# Patient Record
Sex: Female | Born: 1985 | Race: Black or African American | Hispanic: No | Marital: Single | Smoking: Current every day smoker
Health system: Southern US, Community
[De-identification: ages and names within clinical notes are randomized; demographics above are authoritative.]

## PROBLEM LIST (undated history)

## (undated) HISTORY — PX: OTHER SURGICAL HISTORY: SHX169

## (undated) HISTORY — PX: KIDNEY STONE SURGERY: SHX686

---
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
## 2004-08-30 ENCOUNTER — Emergency Department (HOSPITAL_COMMUNITY): Admission: EM | Admit: 2004-08-30 | Discharge: 2004-08-30 | Payer: Self-pay | Admitting: Emergency Medicine

## 2004-09-28 ENCOUNTER — Emergency Department (HOSPITAL_COMMUNITY): Admission: EM | Admit: 2004-09-28 | Discharge: 2004-09-28 | Payer: Self-pay | Admitting: Emergency Medicine

## 2005-11-22 ENCOUNTER — Emergency Department (HOSPITAL_COMMUNITY): Admission: EM | Admit: 2005-11-22 | Discharge: 2005-11-22 | Payer: Self-pay | Admitting: Emergency Medicine

## 2005-11-30 ENCOUNTER — Observation Stay (HOSPITAL_COMMUNITY): Admission: AD | Admit: 2005-11-30 | Discharge: 2005-12-01 | Payer: Self-pay | Admitting: *Deleted

## 2006-01-03 ENCOUNTER — Observation Stay (HOSPITAL_COMMUNITY): Admission: RE | Admit: 2006-01-03 | Discharge: 2006-01-04 | Payer: Self-pay | Admitting: *Deleted

## 2006-01-17 ENCOUNTER — Ambulatory Visit (HOSPITAL_COMMUNITY): Admission: RE | Admit: 2006-01-17 | Discharge: 2006-01-17 | Payer: Self-pay | Admitting: *Deleted

## 2006-02-28 ENCOUNTER — Ambulatory Visit (HOSPITAL_COMMUNITY): Admission: RE | Admit: 2006-02-28 | Discharge: 2006-02-28 | Payer: Self-pay | Admitting: Obstetrics and Gynecology

## 2006-04-14 ENCOUNTER — Emergency Department (HOSPITAL_COMMUNITY): Admission: EM | Admit: 2006-04-14 | Discharge: 2006-04-14 | Payer: Self-pay | Admitting: Emergency Medicine

## 2006-06-22 ENCOUNTER — Inpatient Hospital Stay (HOSPITAL_COMMUNITY): Admission: AD | Admit: 2006-06-22 | Discharge: 2006-06-24 | Payer: Self-pay | Admitting: Obstetrics and Gynecology

## 2007-01-21 ENCOUNTER — Emergency Department (HOSPITAL_COMMUNITY): Admission: EM | Admit: 2007-01-21 | Discharge: 2007-01-21 | Payer: Self-pay | Admitting: Emergency Medicine

## 2007-11-24 ENCOUNTER — Emergency Department (HOSPITAL_COMMUNITY): Admission: EM | Admit: 2007-11-24 | Discharge: 2007-11-24 | Payer: Self-pay | Admitting: Emergency Medicine

## 2010-11-22 ENCOUNTER — Encounter: Payer: Self-pay | Admitting: *Deleted

## 2010-12-06 ENCOUNTER — Emergency Department (HOSPITAL_COMMUNITY)
Admission: EM | Admit: 2010-12-06 | Discharge: 2010-12-06 | Disposition: A | Discharge status: Home / Self Care | Payer: Self-pay | Attending: Emergency Medicine | Admitting: Emergency Medicine

## 2010-12-06 DIAGNOSIS — R112 Nausea with vomiting, unspecified: Secondary | ICD-10-CM | POA: Insufficient documentation

## 2010-12-06 LAB — DIFFERENTIAL
Eosinophils Absolute: 0 10*3/uL (ref 0.0–0.7)
Lymphocytes Relative: 6 % — ABNORMAL LOW (ref 12–46)
Lymphs Abs: 0.7 10*3/uL (ref 0.7–4.0)
Monocytes Absolute: 0.3 10*3/uL (ref 0.1–1.0)
Neutro Abs: 10.6 10*3/uL — ABNORMAL HIGH (ref 1.7–7.7)
Neutrophils Relative %: 91 % — ABNORMAL HIGH (ref 43–77)

## 2010-12-06 LAB — URINALYSIS, ROUTINE W REFLEX MICROSCOPIC
Leukocytes, UA: NEGATIVE
Nitrite: POSITIVE — AB
Specific Gravity, Urine: >1.03 — ABNORMAL HIGH (ref 1.005–1.030)
Urine Glucose, Fasting: NEGATIVE mg/dL
Urobilinogen, UA: 0.2 mg/dL (ref 0.0–1.0)
pH: 5.5 (ref 5.0–8.0)

## 2010-12-06 LAB — BASIC METABOLIC PANEL
BUN: 14 mg/dL (ref 6–23)
CO2: 23 mEq/L (ref 19–32)
Calcium: 9.3 mg/dL (ref 8.4–10.5)
Chloride: 107 mEq/L (ref 96–112)
Creatinine, Ser: 0.86 mg/dL (ref 0.4–1.2)
GFR calc Af Amer: >60 mL/min (ref >60)
Sodium: 138 mEq/L (ref 135–145)

## 2010-12-06 LAB — CBC
HCT: 38.5 % (ref 36.0–46.0)
MCH: 31.9 pg (ref 26.0–34.0)
RDW: 12.1 % (ref 11.5–15.5)

## 2010-12-06 LAB — URINE MICROSCOPIC-ADD ON

## 2011-03-19 NOTE — Op Note (Signed)
[name], [name]                ACCOUNT NO.:  [id number]   MEDICAL RECORD NO.:  [id number]          PATIENT TYPE:  INP   LOCATION:  LDR4                          FACILITY:  [facility]   PHYSICIAN:  Tilda Burrow, M.D. DATE OF BIRTH:  07-24-1986   DATE OF PROCEDURE:  DATE OF DISCHARGE:                                  PROCEDURE NOTE   DELIVERY SUMMARY   LENGTH OF FIRST STAGE OF LABOR:  6 hours 40 minutes   LENGTH OF SECOND STAGE OF LABOR:  20 minutes   LENGTH OF THIRD STAGE OF LABOR:  7 minutes   DELIVERY NOTE:  [name] had a normal spontaneous vaginal delivery of a viable  female infant.  Upon delivery and inspection there is a second degree  perineal laceration noted.  Infant upon delivery was thoroughly suctioned,  dried, cord clamped, to mother's abdomen for newborn care.  Apgars were 9  and 9.  Third stage of labor was actively managed with 20 units of Pitocin  in 1000 mL of D5LR at a rapid rate.  Placenta was delivered spontaneously  via Schultz's mechanism.  Three-vessel cord is noted.  Membranes are noted  to intact upon inspection.  Cord blood gas and cord blood was obtained.  Laceration was repaired with 30 mL of 1% lidocaine and 2-0 Vicryl two  interrupted stitches in subcuticular to close.  Good hemostasis was  obtained.  Estimated blood loss was approximately 350 mL.  Infant and mother  both stabilized and transferred out to the postpartum unit in stable  condition.      [name], Lanier Clam      Tilda Burrow, M.D.  Electronically Signed    DL/MEDQ  D:  62/70/3500  T:  06/22/2006  Job:  [id number]

## 2011-03-19 NOTE — H&P (Signed)
[name], SADEK NO.:  [id number]   MEDICAL RECORD NO.:  [id number]          PATIENT TYPE:  INP   LOCATION:  LDR4                          FACILITY:  [facility]   PHYSICIAN:  Tilda Burrow, M.D. DATE OF BIRTH:  October 01, 1986   DATE OF ADMISSION:  06/22/2006  DATE OF DISCHARGE:  LH                                HISTORY & PHYSICAL   REASON FOR ADMISSION:  Pregnancy at term with active labor, admitted by EMS.   MEDICAL HISTORY:  Positive for incompetent cervix.   SURGICAL HISTORY:  Positive for cerclage which was removed by the [facility]  practice several weeks ago.   ALLERGIES:  She has no known allergies to medications.   MEDICATIONS:  She is not taking any medications at the present time.   FAMILY HISTORY:  Negative.   PRENATAL COURSE:  Her prenatal record has been requested from [facility]. Her HIV is negative. Her blood type is positive but other labs are  pending upon receiving her complete prenatal record from [facility].   PHYSICAL EXAMINATION:  Cervix is 6 cm, completely effaced, +1 station. Vital  signs were stable. Fetal heart pattern is stable.   PLAN:  We are going to admit and expect vaginal delivery and GBS prophylaxis  due to unknown GBS status.      [name], Lanier Clam      Tilda Burrow, M.D.  Electronically Signed    DL/MEDQ  D:  04/54/0981  T:  06/22/2006  Job:  [id number]   cc:   Donna Bernard, M.D.  Fax: [phone]   [facility]

## 2011-03-19 NOTE — H&P (Signed)
NAMEALEXIYA, [name]                ACCOUNT NO.:  [id number]   MEDICAL RECORD NO.:  [id number]          PATIENT TYPE:  OBV   LOCATION:  A419                          FACILITY:  [facility]   PHYSICIAN:  Langley Gauss, MD     DATE OF BIRTH:  [birth year]   DATE OF ADMISSION:  01/03/2006  DATE OF DISCHARGE:  LH                                HISTORY & PHYSICAL   The patient is a 25 year old gravida 3, para 0-0-2-0 at 43 weeks' gestation  who is to be observation status through outpatient surgical unit for  placement of a McDonald cerclage. The patient currently at 15 weeks'  gestation. Her pregnancy at this point in time has been complicated by  minimal amount of vaginal spotting and finding of trichomonas on Pap smear  November 02, 2005. Extensive time and effort is spent during this prenatal  course reviewing the patient's prior history with two prior pregnancy  losses. Records obtained and reviewed from [name]. The patient has had two prior pregnancy  losses. The patient's first pregnancy was followed [name]. Inconsistency is noted in the record here in that there it is stated  gravida 3, para 0-0-2-0, record indicating that she had elective AB and a  spontaneous AB with a false-positive HCG. However, the patient contended  that at that time that was her first pregnancy. She denies any prior history  of elective AB. What happened was on September 23, 2003 at home the patient  experienced spontaneous rupture of membranes but then did not present to  [facility] until September 24, 2003 complaining of bleeding and cramping. At  that point in time, Dr. Christin Bach cared for the patient at [name].  Diagnoses was 17.5 weeks' gestation, inevitable abortion secondary to  premature preterm ruptured membranes. The patient most pertinently describes  no uterine cramping until the day following spontaneous rupture of  membranes. The patient had experienced fetal movement and had prior  documented fetal cardiac activity during that pregnancy. The patient  subsequently was managed expectantly and actually progressed to vaginal  delivery of a 17-week fetal demise weighing 161 grams with placenta showing  moderate to extensive chorioamnionitis. GC and chlamydia cultures noted to  be negative. HIV negative. Rh positive status had been confirmed. No  additional useful history was obtained from the patient's prenatal record,  and there is no indication of any additional follow up following that  pregnancy loss. Subsequently, the patient had a second pregnancy. That  pregnancy, she also received care at [name] prenatally. She had  been seen in the emergency room September 28, 2004 complaining of some pelvic  pain but no vaginal bleeding seen in the emergency room at that time.  Subsequently, the patient did have recurrent problems, presenting to  [name] on November 17, 2004, brought into the emergency room at  [name] by EMS complaining of large amount of vaginal bleeding, severe  cramping in abdomen and pelvis. While sleeping tonight, the patient stated  she had awakened from her  sleep. One week prior OB ultrasound had been  normal at [facility] At that time, apparently Dr. Emelda Fear was no longer  privileged for care at [name], so Dr. Almetta Lovely was contacted to care  for the patient as an OB unassigned. Patient subsequently provided the  additional history that she presented to the emergency room where fetal  heart tones were unobtainable, and shortly after arrival when the nurse was  in the room, the patient became nauseated, sat up on all fours and vomited  and delivered the fetus which was noted to be nonliving, very thin umbilical  cord which was noted to break. However, the placenta subsequently did  spontaneously separate. Dr. Gilford Silvius was contacted for continuing care, but   according to the patient, she was discharged to home on the same date of  service from the labor and delivery. The final pathology revealed a 181-g  female fetus and placenta which appeared to be intact. Subsequently according  to the patient, this is her third pregnancy.   PAST MEDICAL HISTORY:  She otherwise has no other medical or surgical  history. She has no known drug allergies.   CURRENT MEDICATIONS:  The patient was having difficulty taking prenatal  vitamins. She likewise had a significant amount of first trimester nausea  and vomiting with weight loss. Has been tried on p.o. Phenergan, Reglan and  Pepcid as an outpatient without any of these providing any significant  therapeutic response.   She has been hospitalized one time during this pregnancy for the hyperemesis  with weight loss. This pregnancy, likewise, she is noted to have negative GC  and chlamydia cultures, is noted to have normal appearing crown-rump length  that is 7 weeks and 2 days with an [facility] of July 02, 2006, and most  recently fetal viability is documented one week previously with a single  viable intrauterine pregnancy noted on ultrasound. The remainder of the  patient's prenatal profile has been ordered, but the results are currently  not available.   PHYSICAL EXAMINATION:  VITAL SIGNS:  On physical examination, she is noted  to be 5 feet 2. Prepregnancy weight is 115. Her weight has now returned to  110 pounds. Blood pressure 98/57, pulse rate 80, respiratory as 20.  HEENT:  Negative. No adenopathy. Neck is supple. Thyroid is nonpalpable.  LUNGS:  Clear.  CARDIOVASCULAR:  Regular rate and rhythm.  ABDOMEN:  The abdomen is soft and nontender. She is noted to have very thin  abdomen. Uterus is soft and nontender consistent with 15-week size. Fetal  heart tones are easily auscultated.  EXTREMITIES:  Normal. PELVIC:  Pelvic examination revealed normal external genitalia. No lesions  or ulcerations  identified. She did previously have some mild minimal vaginal  spotting. Cervix is noted to be closed but is digitally very soft and seems  slightly shortened at about 3 cm in length. She is noted to have a frothy  malodorous discharge consistent with trichomonas which has been untreated to  this point in time secondary to the patient's nausea and vomiting and thus  obviously would be intolerant of p.o. Flagyl.   ASSESSMENT/PLAN:  The patient's first pregnancy loss very easily consistent  with incompetent cervix with spontaneous rupture of membranes before any  uterine cramping. Uterine cramping started following day which prompted her  seeking medical attention at [facility] and under the care of Dr. Christin Bach was noted to deliver a second trimester nonliving abortus. On the  patient's second pregnancy,  no significant notations were made regarding the  patient's prior pregnancy loss. During the second pregnancy, the patient did  have some moderate uterine cramping prior to presenting to [facility] were  subsequently she delivered the nonliving second trimester abortus shortly  after arrival to the emergency room. This likewise could be consistent with  incompetent cervix. There is some discrepancy in the records obtained  regarding whether not the patient has had a prior elective termination of  pregnancy on which would be a risk factor for development of the incompetent  cervix, but the patient at this point in time denies prior elective  termination of pregnancy, stating that this is her third pregnancy and third  pregnancy only. As stated previously, she has no living children been. In  light of consideration of a two prior second trimester pregnancy losses,  plan on placing a McDonald cerclage. Discussed extensively with the patient  that the placement of McDonald cerclage is for management and hopefully  prevention of a second trimester pregnancy loss if in fact it is due to   incompetent cervix. The patient is made fully aware that there is no  assurance or guarantee that this will allow the pregnancy to progress to  term. She is also made aware that should the onset of significant uterine  cramping occur the cerclage would have to be removed at that time to prevent  tearing the cervix. Thus, made it clear to her that the purposes cerclage is  for prevention of pregnancy loss from incompetent cervix only and not for  management of a second trimester loss for any other purposes. The patient  expresses understanding. Risks of surgery discussed with the patient to  include risks of cervical laceration or vaginal bleeding. It is scheduled as  a  possible which will be January 03, 2006 at 0730 for placement of McDonald  cerclage, likely utilizing a spinal analgesic. Due to the patient's active  trichomonas at this time which has not been treated, orders are written for 500 mg of IV Flagyl to be given immediately preoperatively.      Langley Gauss, MD  Electronically Signed     DC/MEDQ  D:  01/03/2006  T:  01/03/2006  Job:  [id number]   cc:   [facility]  Fax: [phone]

## 2011-03-19 NOTE — Op Note (Signed)
[name], [name]                ACCOUNT NO.:  [id number]   MEDICAL RECORD NO.:  [id number]          PATIENT TYPE:  OBV   LOCATION:  A419                          FACILITY:  [facility]   PHYSICIAN:  Langley Gauss, MD     DATE OF BIRTH:  [birth year]   DATE OF PROCEDURE:  01/03/2006  DATE OF DISCHARGE:                                 OPERATIVE REPORT   PREOPERATIVE DIAGNOSES:  1.  A 15-week intrauterine pregnancy.  2.  History of two second trimester pregnancy losses, history consistent      with incompetent cervix.   PROCEDURE:  Performance of placement of McDonald cerclage utilizing two  circumferential sutures of #1 Novofil.   SURGEON:  Dr. Langley Gauss.   ESTIMATED BLOOD LOSS:  Minimal.   COMPLICATIONS:  None.   SPECIMENS:  None   FINDINGS AT SURGERY:  [place] very soft cervix appears to be fingertip  dilated, very friable additional portion of cervix secondary to inflammatory  Trichomonas.   Planned procedure discussed with the patient both preoperatively and again  today in the immediate preoperative area. The patient is to be treated with  500 mg of IV Flagyl as she is noted to have Trichomonas on her Pap smear,  and she would be unable to tolerate p.o. Flagyl at this time. The patient  was taken to the operating room. Fetal heart tones were auscultated in 150s.  She then had placement of spinal analgesic without complications. She was  placed in the candy-cane lithotomy stirrups, prepped and draped usual  sterile manner. Sterile speculum examination is performed. Additional prep  is performed on the cervix. Leukorrhea is identified from the visible  portion of the cervix. The 12 o'clock position is grasped with a sponge  stick. Proceeding in a counterclockwise manner then, #1 Novofil suture is  then placed in a circumferential manner with four bites taken of the cervix,  continuing around to a pursestring which is then tied at 12 o'clock position  with a blind loop  of suture left in place. A second suture is then placed in  a likewise manner higher up on the cervix starting at the 12 o'clock  position and again continuing around a counterclockwise manner until its  again tied at the 12 o'clock position, and a blind loop of suture is placed  as an  additional knot. Minimal bleeding is noted following the procedure. The  patient is taken to the recovery room in stable condition. She will be  treated over the next 24 hours with IV Flagyl for the Trichomonas. No family  members were available to discuss operative findings immediately after  procedure.      Langley Gauss, MD  Electronically Signed     DC/MEDQ  D:  01/03/2006  T:  01/04/2006  Job:  [id number]

## 2011-03-19 NOTE — Discharge Summary (Signed)
[name], [name]                ACCOUNT NO.:  [id number]   MEDICAL RECORD NO.:  [id number]          PATIENT TYPE:  INP   LOCATION:  A403                          FACILITY:  [facility]   PHYSICIAN:  Tilda Burrow, M.D. DATE OF BIRTH:  1986/01/07   DATE OF ADMISSION:  06/22/2006  DATE OF DISCHARGE:  08/24/2007LH                                 DISCHARGE SUMMARY   ADMISSION DIAGNOSIS:  Active labor at term, admitted by Emergency Medical  Service.   DISCHARGE DIAGNOSIS:  Spontaneous vaginal delivery of a five pound, nine  ounce female without complications.  She had a second-degree laceration with  repair.   HOSPITAL COURSE:  The estimated blood loss was 350 mL.  The Apgars of the  baby were 9 and 9.  Her follow-up H&H is 9.8 and 28.8 respectively.  Her  vital signs have been stable.  She is bottle feeding.  Ms. Encarnacion was  treated prophylactically for Group-B Streptococcus due to unknown status.   DISPOSITION:  She is being discharged home today and wishes to follow up  with Dr. Roger Kill in [place].  We will make her an appointment for that in four  weeks.   DISCHARGE MEDICATIONS:  1. With #20 Tylox.  2. Motrin 800 mg, #40.      Jacklyn Shell, C.N.M.      Tilda Burrow, M.D.  Electronically Signed    FC/MEDQ  D:  06/24/2006  T:  06/24/2006  Job:  [id number]

## 2011-03-19 NOTE — Discharge Summary (Signed)
[name], [name]                ACCOUNT NO.:  [id number]   MEDICAL RECORD NO.:  [id number]          PATIENT TYPE:  OBV   LOCATION:  A419                          FACILITY:  [facility]   PHYSICIAN:  Langley Gauss, MD     DATE OF BIRTH:  02/04/1986   DATE OF ADMISSION:  01/03/2006  DATE OF DISCHARGE:  03/06/2007LH                                 DISCHARGE SUMMARY   The observation period extended from March 5 to January 04, 2006.   DIAGNOSES:  1.  A 15-week intrauterine pregnancy.  2.  History consistent with incompetent cervix.  3.  Trichomonas vaginitis.   PROCEDURE PERFORMED:  Placement of a McDonald cerclage utilizing two sutures  tied at the 12 o'clock position.   PERTINENT LABORATORY DATA:  The patient was noted to have trichomonas on pap  smear done prenatally. Thus, during this hospitalization, she was treated  with Flagyl 500 mg IV immediately preoperatively followed by 500 mg q.8h.  for treatment of the trichomonas. In addition, she does have Metrogel  vaginal gel at home which she can use as needed should vaginal discharge  occur.   DISPOSITION:  She is given instructions to follow up in the office in one  week's time. She did have a first trimester ultrasound done, but we will  ultimately require an anatomic survey at 18 weeks' gestation.   Prenatal profile 1 is ordered during this hospital stay as well as AFP  extra, but it is unclear if this will be a useful result as paper for AFP  extra was not available at [place]. The patient is given strict  instructions for modified bedrest during the remainder of the pregnancy.  Also is advised to avoid sexual intercourse. The patient is not employed, so  prolonged standing is not a problem.   HOSPITAL COURSE:  January 03, 2006, the patient was taken to the operating room  where utilizing a spinal. It was very easy to place McDonald cerclage. As  stated previously, postoperatively the patient did have a small amount of  vaginal bleeding which subsided. She had absolutely no complaints of uterine  cramping following this. She did well and was discharged home on  hospitalization day #1 with absolutely no complaints of vaginal discharge at  this time.      Langley Gauss, MD  Electronically Signed     DC/MEDQ  D:  01/04/2006  T:  01/05/2006  Job:  [id number]

## 2011-03-19 NOTE — H&P (Signed)
NAMEELLIS, KOFFLER                ACCOUNT NO.:  [id number]   MEDICAL RECORD NO.:  [id number]          PATIENT TYPE:  OIB   LOCATION:  A417                          FACILITY:  [facility]   PHYSICIAN:  Langley Gauss, MD     DATE OF BIRTH:  April 19, 1986   DATE OF ADMISSION:  11/30/2005  DATE OF DISCHARGE:  LH                                HISTORY & PHYSICAL   HISTORY OF PRESENT ILLNESS:  The patient is a 25 year old gravida 1, para 0  at 9+ weeks gestation as verified by a 7-week ultrasound.  She was noted to  have fetal heart tones with Doppler today.  She was seen in the office 2  weeks previously with a weight of 112 pounds and complaining of some mild  nausea and vomiting. Notably her pre-pregnancy weight is 115. The patient  states since that office visit, she has had nausea and vomiting on nearly a  daily basis with very poor tolerance of any p.o. intake. I did call her in  Phenergan last night to [facility] pharmacy, but she has not picked these up.  According to her, she is not taking any prescription or over-the-counter  medicines for the nausea and vomiting.   PAST MEDICAL HISTORY:  Otherwise negative.   ALLERGIES:  She has no known drug allergies.   SOCIAL HISTORY:  She is unemployed.  Father of baby is also unemployed.   PHYSICAL EXAMINATION:  GENERAL:  The patient appears slightly lethargic.  VITAL SIGNS:  Blood pressure is 101/78, pulse rate 80, respiratory rate is  20.  HEENT:  Negative. Mucous membranes dry.  NECK:  Supple. No adenopathy.  LUNGS:  Clear.  CARDIOVASCULAR:  Regular rate and rhythm.  ABDOMEN:  Soft and tender.  Minimally enlarged uterus.  Fetal heart tones  are auscultated.  EXTREMITIES:  Normal.  PELVIC:  Deferred.   ASSESSMENT:  First trimester pregnancy with a total of 13 pounds of weight  loss at this point in time.  She is a referred to [facility] on  observation status, and, depending on therapeutic improvement, may be  converted to  admission status.      Langley Gauss, MD  Electronically Signed     DC/MEDQ  D:  11/30/2005  T:  11/30/2005  Job:  [id number]

## 2011-07-22 LAB — URINALYSIS, ROUTINE W REFLEX MICROSCOPIC
Leukocytes, UA: NEGATIVE
Nitrite: NEGATIVE
Urobilinogen, UA: 0.2

## 2011-07-22 LAB — URINE MICROSCOPIC-ADD ON

## 2012-01-26 ENCOUNTER — Encounter (HOSPITAL_COMMUNITY): Payer: Self-pay

## 2012-01-26 ENCOUNTER — Emergency Department (HOSPITAL_COMMUNITY)
Admission: EM | Admit: 2012-01-26 | Discharge: 2012-01-26 | Disposition: A | Discharge status: Home / Self Care | Payer: Self-pay | Attending: Emergency Medicine | Admitting: Emergency Medicine

## 2012-01-26 DIAGNOSIS — K5732 Diverticulitis of large intestine without perforation or abscess without bleeding: Secondary | ICD-10-CM | POA: Insufficient documentation

## 2012-01-26 DIAGNOSIS — R10819 Abdominal tenderness, unspecified site: Secondary | ICD-10-CM | POA: Insufficient documentation

## 2012-01-26 DIAGNOSIS — K5792 Diverticulitis of intestine, part unspecified, without perforation or abscess without bleeding: Secondary | ICD-10-CM

## 2012-01-26 LAB — COMPREHENSIVE METABOLIC PANEL
ALT: 13 U/L (ref 0–35)
AST: 15 U/L (ref 0–37)
Alkaline Phosphatase: 52 U/L (ref 39–117)
BUN: 16 mg/dL (ref 6–23)
Calcium: 9.5 mg/dL (ref 8.4–10.5)
Chloride: 103 mEq/L (ref 96–112)
Creatinine, Ser: 0.79 mg/dL (ref 0.50–1.10)
GFR calc Af Amer: >90 mL/min (ref >90)
Glucose, Bld: 102 mg/dL — ABNORMAL HIGH (ref 70–99)
Total Protein: 7.3 g/dL (ref 6.0–8.3)

## 2012-01-26 LAB — DIFFERENTIAL
Basophils Relative: 0 % (ref 0–1)
Lymphocytes Relative: 42 % (ref 12–46)
Monocytes Absolute: 0.4 10*3/uL (ref 0.1–1.0)
Monocytes Relative: 6 % (ref 3–12)
Neutro Abs: 3.3 10*3/uL (ref 1.7–7.7)
Neutrophils Relative %: 50 % (ref 43–77)

## 2012-01-26 LAB — URINALYSIS, ROUTINE W REFLEX MICROSCOPIC
Bilirubin Urine: NEGATIVE
Ketones, ur: NEGATIVE mg/dL
Nitrite: NEGATIVE
Urobilinogen, UA: 0.2 mg/dL (ref 0.0–1.0)

## 2012-01-26 LAB — CBC
MCH: 31.3 pg (ref 26.0–34.0)
MCV: 91.9 fL (ref 78.0–100.0)
Platelets: 253 10*3/uL (ref 150–400)
WBC: 6.7 10*3/uL (ref 4.0–10.5)

## 2012-01-26 MED ORDER — METRONIDAZOLE 500 MG PO TABS
500.0000 mg | ORAL_TABLET | Freq: Once | ORAL | Status: AC
  Administered 2012-01-26: 500 mg via ORAL
  Filled 2012-01-26: qty 1

## 2012-01-26 MED ORDER — MORPHINE SULFATE 4 MG/ML IJ SOLN
4.0000 mg | Freq: Once | INTRAMUSCULAR | Status: AC
  Administered 2012-01-26: 4 mg via INTRAVENOUS
  Filled 2012-01-26: qty 1

## 2012-01-26 MED ORDER — SODIUM CHLORIDE 0.9 % IV BOLUS (SEPSIS)
1000.0000 mL | Freq: Once | INTRAVENOUS | Status: AC
  Administered 2012-01-26: 1000 mL via INTRAVENOUS

## 2012-01-26 MED ORDER — OXYCODONE-ACETAMINOPHEN 5-325 MG PO TABS: 1.0000 | ORAL_TABLET | ORAL | Status: AC | PRN

## 2012-01-26 MED ORDER — SODIUM CHLORIDE 0.9 % IV SOLN: Freq: Once | INTRAVENOUS | Status: DC

## 2012-01-26 MED ORDER — METRONIDAZOLE 500 MG PO TABS: 500.0000 mg | ORAL_TABLET | Freq: Three times a day (TID) | ORAL | Status: AC

## 2012-01-26 MED ORDER — CIPROFLOXACIN HCL 250 MG PO TABS
500.0000 mg | ORAL_TABLET | Freq: Once | ORAL | Status: AC
  Administered 2012-01-26: 500 mg via ORAL
  Filled 2012-01-26: qty 2

## 2012-01-26 MED ORDER — ONDANSETRON HCL 4 MG/2ML IJ SOLN
4.0000 mg | Freq: Once | INTRAMUSCULAR | Status: AC
  Administered 2012-01-26: 4 mg via INTRAVENOUS
  Filled 2012-01-26: qty 2

## 2012-01-26 MED ORDER — CIPROFLOXACIN HCL 500 MG PO TABS: 500.0000 mg | ORAL_TABLET | Freq: Two times a day (BID) | ORAL | Status: AC

## 2012-01-26 MED ORDER — METOCLOPRAMIDE HCL 10 MG PO TABS: 10.0000 mg | ORAL_TABLET | Freq: Four times a day (QID) | ORAL | Status: AC | PRN

## 2012-01-26 NOTE — Discharge Instructions (Signed)
Diverticulitis A diverticulum is a small pouch or sac on the colon. Diverticulosis is the presence of these diverticula on the colon. Diverticulitis is the irritation (inflammation) or infection of diverticula. CAUSES  The colon and its diverticula contain bacteria. If food particles block the tiny opening to a diverticulum, the bacteria inside can grow and cause an increase in pressure. This leads to infection and inflammation and is called diverticulitis. SYMPTOMS   Abdominal pain and tenderness. Usually, the pain is located on the left side of your abdomen. However, it could be located elsewhere.   Fever.   Bloating.   Feeling sick to your stomach (nausea).   Throwing up (vomiting).   Abnormal stools.  DIAGNOSIS  Your caregiver will take a history and perform a physical exam. Since many things can cause abdominal pain, other tests may be necessary. Tests may include:  Blood tests.   Urine tests.   X-ray of the abdomen.   CT scan of the abdomen.  Sometimes, surgery is needed to determine if diverticulitis or other conditions are causing your symptoms. TREATMENT  Most of the time, you can be treated without surgery. Treatment includes:  Resting the bowels by only having liquids for a few days. As you improve, you will need to eat a low-fiber diet.   Intravenous (IV) fluids if you are losing body fluids (dehydrated).   Antibiotic medicines that treat infections may be given.   Pain and nausea medicine, if needed.   Surgery if the inflamed diverticulum has burst.  HOME CARE INSTRUCTIONS   Try a clear liquid diet (broth, tea, or water for as long as directed by your caregiver). You may then gradually begin a low-fiber diet as tolerated. A low-fiber diet is a diet with less than 10 grams of fiber. Choose the foods below to reduce fiber in the diet:   White breads, cereals, rice, and pasta.   Cooked fruits and vegetables or soft fresh fruits and vegetables without the skin.     Ground or well-cooked tender beef, ham, veal, lamb, pork, or poultry.   Eggs and seafood.   After your diverticulitis symptoms have improved, your caregiver may put you on a high-fiber diet. A high-fiber diet includes 14 grams of fiber for every 1000 calories consumed. For a standard 2000 calorie diet, you would need 28 grams of fiber. Follow these diet guidelines to help you increase the fiber in your diet. It is important to slowly increase the amount fiber in your diet to avoid gas, constipation, and bloating.   Choose whole-grain breads, cereals, pasta, and brown rice.   Choose fresh fruits and vegetables with the skin on. Do not overcook vegetables because the more vegetables are cooked, the more fiber is lost.   Choose more nuts, seeds, legumes, dried peas, beans, and lentils.   Look for food products that have greater than 3 grams of fiber per serving on the Nutrition Facts label.   Take all medicine as directed by your caregiver.   If your caregiver has given you a follow-up appointment, it is very important that you go. Not going could result in lasting (chronic) or permanent injury, pain, and disability. If there is any problem keeping the appointment, call to reschedule.  SEEK MEDICAL CARE IF:   Your pain does not improve.   You have a hard time advancing your diet beyond clear liquids.   Your bowel movements do not return to normal.  SEEK IMMEDIATE MEDICAL CARE IF:   Your pain becomes   worse.   You have an oral temperature above 102 F (38.9 C), not controlled by medicine.   You have repeated vomiting.   You have bloody or black, tarry stools.   Symptoms that brought you to your caregiver become worse or are not getting better.  MAKE SURE YOU:   Understand these instructions.   Will watch your condition.   Will get help right away if you are not doing well or get worse.  Document Released: 07/28/2005 Document Revised: 10/07/2011 Document Reviewed:  11/23/2010 [facility] Patient Information 2012 [place], [place].  Acetaminophen; Oxycodone tablets What is this medicine? ACETAMINOPHEN; OXYCODONE (a set a MEE noe fen; ox i KOE done) is a pain reliever. It is used to treat mild to moderate pain. This medicine may be used for other purposes; ask your health care provider or pharmacist if you have questions. What should I tell my health care provider before I take this medicine? They need to know if you have any of these conditions: -brain tumor -Crohn's disease, inflammatory bowel disease, or ulcerative colitis -drink more than 3 alcohol containing drinks per day -drug abuse or addiction -head injury -heart or circulation problems -kidney disease or problems going to the bathroom -liver disease -lung disease, asthma, or breathing problems -an unusual or allergic reaction to acetaminophen, oxycodone, other opioid analgesics, other medicines, foods, dyes, or preservatives -pregnant or trying to get pregnant -breast-feeding How should I use this medicine? Take this medicine by mouth with a full glass of water. Follow the directions on the prescription label. Take your medicine at regular intervals. Do not take your medicine more often than directed. Talk to your pediatrician regarding the use of this medicine in children. Special care may be needed. Patients over 86 years old may have a stronger reaction and need a smaller dose. Overdosage: If you think you have taken too much of this medicine contact a poison control center or emergency room at once. NOTE: This medicine is only for you. Do not share this medicine with others. What if I miss a dose? If you miss a dose, take it as soon as you can. If it is almost time for your next dose, take only that dose. Do not take double or extra doses. What may interact with this medicine? -alcohol or medicines that contain alcohol -antihistamines -barbiturates like amobarbital, butalbital,  butabarbital, methohexital, pentobarbital, phenobarbital, thiopental, and secobarbital -benztropine -drugs for bladder problems like solifenacin, trospium, oxybutynin, tolterodine, hyoscyamine, and methscopolamine -drugs for breathing problems like ipratropium and tiotropium -drugs for certain stomach or intestine problems like propantheline, homatropine methylbromide, glycopyrrolate, atropine, belladonna, and dicyclomine -general anesthetics like etomidate, ketamine, nitrous oxide, propofol, desflurane, enflurane, halothane, isoflurane, and sevoflurane -medicines for depression, anxiety, or psychotic disturbances -medicines for pain like codeine, morphine, pentazocine, buprenorphine, butorphanol, nalbuphine, tramadol, and propoxyphene -medicines for sleep -muscle relaxants -naltrexone -phenothiazines like perphenazine, thioridazine, chlorpromazine, mesoridazine, fluphenazine, prochlorperazine, promazine, and trifluoperazine -scopolamine -trihexyphenidyl This list may not describe all possible interactions. Give your health care provider a list of all the medicines, herbs, non-prescription drugs, or dietary supplements you use. Also tell them if you smoke, drink alcohol, or use illegal drugs. Some items may interact with your medicine. What should I watch for while using this medicine? Tell your doctor or health care professional if your pain does not go away, if it gets worse, or if you have new or a different type of pain. You may develop tolerance to the medicine. Tolerance means that you will need a higher  dose of the medication for pain relief. Tolerance is normal and is expected if you take this medicine for a long time. Do not suddenly stop taking your medicine because you may develop a severe reaction. Your body becomes used to the medicine. This does NOT mean you are addicted. Addiction is a behavior related to getting and using a drug for a nonmedical reason. If you have pain, you have a  medical reason to take pain medicine. Your doctor will tell you how much medicine to take. If your doctor wants you to stop the medicine, the dose will be slowly lowered over time to avoid any side effects. You may get drowsy or dizzy. Do not drive, use machinery, or do anything that needs mental alertness until you know how this medicine affects you. Do not stand or sit up quickly, especially if you are an older patient. This reduces the risk of dizzy or fainting spells. Alcohol may interfere with the effect of this medicine. Avoid alcoholic drinks. The medicine will cause constipation. Try to have a bowel movement at least every 2 to 3 days. If you do not have a bowel movement for 3 days, call your doctor or health care professional. Do not take Tylenol (acetaminophen) or medicines that have acetaminophen with this medicine. Too much acetaminophen can be very dangerous. Many nonprescription medicines contain acetaminophen. Always read the labels carefully to avoid taking more acetaminophen. What side effects may I notice from receiving this medicine? Side effects that you should report to your doctor or health care professional as soon as possible: -allergic reactions like skin rash, itching or hives, swelling of the face, lips, or tongue -breathing difficulties, wheezing -confusion -light headedness or fainting spells -severe stomach pain -yellowing of the skin or the whites of the eyes Side effects that usually do not require medical attention (report to your doctor or health care professional if they continue or are bothersome): -dizziness -drowsiness -nausea -vomiting This list may not describe all possible side effects. Call your doctor for medical advice about side effects. You may report side effects to FDA at 1-800-FDA-1088. Where should I keep my medicine? Keep out of the reach of children. This medicine can be abused. Keep your medicine in a safe place to protect it from theft. Do not  share this medicine with anyone. Selling or giving away this medicine is dangerous and against the law. Store at room temperature between 20 and 25 degrees C (68 and 77 degrees F). Keep container tightly closed. Protect from light. Flush any unused medicines down the toilet. Do not use the medicine after the expiration date. NOTE: This sheet is a summary. It may not cover all possible information. If you have questions about this medicine, talk to your doctor, pharmacist, or health care provider.  2012, [employer]. (09/16/2008 10:01:21 AM)  Ciprofloxacin tablets What is this medicine? CIPROFLOXACIN (sip roe FLOX a sin) is a quinolone antibiotic. It is used to treat certain kinds of bacterial infections. It will not work for colds, flu, or other viral infections. This medicine may be used for other purposes; ask your health care provider or pharmacist if you have questions. What should I tell my health care provider before I take this medicine? They need to know if you have any of these conditions: -heart condition -joint problems -kidney disease -liver disease -myasthenia gravis -seizure disorder -an unusual or allergic reaction to ciprofloxacin, other antibiotics or medicines, foods, dyes, or preservatives -pregnant or trying to get pregnant -breast-feeding  How should I use this medicine? Take this medicine by mouth with a glass of water. Follow the directions on the prescription label. Take your medicine at regular intervals. Do not take your medicine more often than directed. Take all of your medicine as directed even if you think your are better. Do not skip doses or stop your medicine early. You can take this medicine with food or on an empty stomach. It can be taken with a meal that contains dairy or calcium, but do not take it alone with a dairy product, like milk or yogurt or calcium-fortified juice. A special MedGuide will be given to you by the pharmacist with each  prescription and refill. Be sure to read this information carefully each time. Talk to your pediatrician regarding the use of this medicine in children. Special care may be needed. Overdosage: If you think you have taken too much of this medicine contact a poison control center or emergency room at once. NOTE: This medicine is only for you. Do not share this medicine with others. What if I miss a dose? If you miss a dose, take it as soon as you can. If it is almost time for your next dose, take only that dose. Do not take double or extra doses. What may interact with this medicine? Do not take this medicine with any of the following medications: -cisapride -droperidol -terfenadine -tizanidine This medicine may also interact with the following medications: -antacids -caffeine -cyclosporin -didanosine (ddI) buffered tablets or powder -medicines for diabetes -medicines for inflammation like ibuprofen, naproxen -methotrexate -multivitamins -omeprazole -phenytoin -probenecid -sucralfate -theophylline -warfarin This list may not describe all possible interactions. Give your health care provider a list of all the medicines, herbs, non-prescription drugs, or dietary supplements you use. Also tell them if you smoke, drink alcohol, or use illegal drugs. Some items may interact with your medicine. What should I watch for while using this medicine? Tell your doctor or health care professional if your symptoms do not improve. Do not treat diarrhea with over the counter products. Contact your doctor if you have diarrhea that lasts more than 2 days or if it is severe and watery. You may get drowsy or dizzy. Do not drive, use machinery, or do anything that needs mental alertness until you know how this medicine affects you. Do not stand or sit up quickly, especially if you are an older patient. This reduces the risk of dizzy or fainting spells. This medicine can make you more sensitive to the sun.  Keep out of the sun. If you cannot avoid being in the sun, wear protective clothing and use sunscreen. Do not use sun lamps or tanning beds/booths. Avoid antacids, aluminum, calcium, iron, magnesium, and zinc products for 6 hours before and 2 hours after taking a dose of this medicine. What side effects may I notice from receiving this medicine? Side effects that you should report to your doctor or health care professional as soon as possible: -allergic reactions like skin rash, itching or hives, swelling of the face, lips, or tongue -breathing problems -confusion, nightmares or hallucinations -feeling faint or lightheaded, falls -irregular heartbeat -joint, muscle or tendon pain or swelling -pain or trouble passing urine -redness, blistering, peeling or loosening of the skin, including inside the mouth -seizure -unusual pain, numbness, tingling, or weakness Side effects that usually do not require medical attention (report to your doctor or health care professional if they continue or are bothersome): -diarrhea -nausea or stomach upset -white patches or sores  in the mouth This list may not describe all possible side effects. Call your doctor for medical advice about side effects. You may report side effects to FDA at 1-800-FDA-1088. Where should I keep my medicine? Keep out of the reach of children. Store at room temperature below 30 degrees C (86 degrees F). Keep container tightly closed. Throw away any unused medicine after the expiration date. NOTE: This sheet is a summary. It may not cover all possible information. If you have questions about this medicine, talk to your doctor, pharmacist, or health care provider.  2012, [employer]. (01/05/2010 11:41:11 AM)  Metronidazole tablets or capsules What is this medicine? METRONIDAZOLE (me troe NI da zole) is an antiinfective. It is used to treat certain kinds of bacterial and protozoal infections. It will not work for colds, flu,  or other viral infections. This medicine may be used for other purposes; ask your health care provider or pharmacist if you have questions. What should I tell my health care provider before I take this medicine? They need to know if you have any of these conditions: -anemia or other blood disorders -disease of the nervous system -fungal or yeast infection -if you drink alcohol containing drinks -liver disease -seizures -an unusual or allergic reaction to metronidazole, or other medicines, foods, dyes, or preservatives -pregnant or trying to get pregnant -breast-feeding How should I use this medicine? Take this medicine by mouth with a full glass of water. Follow the directions on the prescription label. Take your medicine at regular intervals. Do not take your medicine more often than directed. Take all of your medicine as directed even if you think you are better. Do not skip doses or stop your medicine early. Talk to your pediatrician regarding the use of this medicine in children. Special care may be needed. Overdosage: If you think you have taken too much of this medicine contact a poison control center or emergency room at once. NOTE: This medicine is only for you. Do not share this medicine with others. What if I miss a dose? If you miss a dose, take it as soon as you can. If it is almost time for your next dose, take only that dose. Do not take double or extra doses. What may interact with this medicine? Do not take this medicine with any of the following medications: -alcohol or any product that contains alcohol -amprenavir oral solution -disulfiram -paclitaxel injection -ritonavir oral solution -sertraline oral solution -sulfamethoxazole-trimethoprim injection This medicine may also interact with the following medications: -cimetidine -lithium -phenobarbital -phenytoin -warfarin This list may not describe all possible interactions. Give your health care provider a list of  all the medicines, herbs, non-prescription drugs, or dietary supplements you use. Also tell them if you smoke, drink alcohol, or use illegal drugs. Some items may interact with your medicine. What should I watch for while using this medicine? Tell your doctor or health care professional if your symptoms do not improve or if they get worse. You may get drowsy or dizzy. Do not drive, use machinery, or do anything that needs mental alertness until you know how this medicine affects you. Do not stand or sit up quickly, especially if you are an older patient. This reduces the risk of dizzy or fainting spells. Avoid alcoholic drinks while you are taking this medicine and for three days afterward. Alcohol may make you feel dizzy, sick, or flushed. If you are being treated for a sexually transmitted disease, avoid sexual contact until you have finished  your treatment. Your sexual partner may also need treatment. What side effects may I notice from receiving this medicine? Side effects that you should report to your doctor or health care professional as soon as possible: -allergic reactions like skin rash or hives, swelling of the face, lips, or tongue -confusion, clumsiness -difficulty speaking -discolored or sore mouth -dizziness -fever, infection -numbness, tingling, pain or weakness in the hands or feet -trouble passing urine or change in the amount of urine -redness, blistering, peeling or loosening of the skin, including inside the mouth -seizures -unusually weak or tired -vaginal irritation, dryness, or discharge Side effects that usually do not require medical attention (report to your doctor or health care professional if they continue or are bothersome): -diarrhea -headache -irritability -metallic taste -nausea -stomach pain or cramps -trouble sleeping This list may not describe all possible side effects. Call your doctor for medical advice about side effects. You may report side effects  to FDA at 1-800-FDA-1088. Where should I keep my medicine? Keep out of the reach of children. Store at room temperature below 25 degrees C (77 degrees F). Protect from light. Keep container tightly closed. Throw away any unused medicine after the expiration date. NOTE: This sheet is a summary. It may not cover all possible information. If you have questions about this medicine, talk to your doctor, pharmacist, or health care provider.  2012, [employer]. (08/05/2008 10:37:11 AM)  Metoclopramide tablets What is this medicine? METOCLOPRAMIDE (met oh kloe PRA mide) is used to treat the symptoms of gastroesophageal reflux disease (GERD) like heartburn. It is also used to treat people with slow emptying of the stomach and intestinal tract. This medicine may be used for other purposes; ask your health care provider or pharmacist if you have questions. What should I tell my health care provider before I take this medicine? They need to know if you have any of these conditions: -breast cancer -depression -diabetes -heart failure -high blood pressure -kidney disease -liver disease -Parkinson's disease or a movement disorder -pheochromocytoma -seizures -stomach obstruction, bleeding, or perforation -an unusual or allergic reaction to metoclopramide, procainamide, sulfites, other medicines, foods, dyes, or preservatives -pregnant or trying to get pregnant -breast-feeding How should I use this medicine? Take this medicine by mouth with a glass of water. Follow the directions on the prescription label. Take this medicine on an empty stomach, about 30 minutes before eating. Take your doses at regular intervals. Do not take your medicine more often than directed. Do not stop taking except on the advice of your doctor or health care professional. A special MedGuide will be given to you by the pharmacist with each prescription and refill. Be sure to read this information carefully each  time. Talk to your pediatrician regarding the use of this medicine in children. Special care may be needed. Overdosage: If you think you have taken too much of this medicine contact a poison control center or emergency room at once. NOTE: This medicine is only for you. Do not share this medicine with others. What if I miss a dose? If you miss a dose, take it as soon as you can. If it is almost time for your next dose, take only that dose. Do not take double or extra doses. What may interact with this medicine? -acetaminophen -cyclosporine -digoxin -medicines for blood pressure -medicines for diabetes, including insulin -medicines for hay fever and other allergies -medicines for depression, especially an Monoamine Oxidase Inhibitor (MAOI) -medicines for Parkinson's disease, like levodopa -medicines for sleep  or for pain -tetracycline This list may not describe all possible interactions. Give your health care provider a list of all the medicines, herbs, non-prescription drugs, or dietary supplements you use. Also tell them if you smoke, drink alcohol, or use illegal drugs. Some items may interact with your medicine. What should I watch for while using this medicine? It may take a few weeks for your stomach condition to start to get better. However, do not take this medicine for longer than 12 weeks. The longer you take this medicine, and the more you take it, the greater your chances are of developing serious side effects. If you are an elderly patient, a female patient, or you have diabetes, you may be at an increased risk for side effects from this medicine. Contact your doctor immediately if you start having movements you cannot control such as lip smacking, rapid movements of the tongue, involuntary or uncontrollable movements of the eyes, head, arms and legs, or muscle twitches and spasms. Patients and their families should watch out for worsening depression or thoughts of suicide. Also watch  out for any sudden or severe changes in feelings such as feeling anxious, agitated, panicky, irritable, hostile, aggressive, impulsive, severely restless, overly excited and hyperactive, or not being able to sleep. If this happens, especially at the beginning of treatment or after a change in dose, call your doctor. Do not treat yourself for high fever. Ask your doctor or health care professional for advice. You may get drowsy or dizzy. Do not drive, use machinery, or do anything that needs mental alertness until you know how this drug affects you. Do not stand or sit up quickly, especially if you are an older patient. This reduces the risk of dizzy or fainting spells. Alcohol can make you more drowsy and dizzy. Avoid alcoholic drinks. What side effects may I notice from receiving this medicine? Side effects that you should report to your doctor or health care professional as soon as possible: -allergic reactions like skin rash, itching or hives, swelling of the face, lips, or tongue -abnormal production of milk in females -breast enlargement in both males and females -change in the way you walk -difficulty moving, speaking or swallowing -drooling, lip smacking, or rapid movements of the tongue -excessive sweating -fever -involuntary or uncontrollable movements of the eyes, head, arms and legs -irregular heartbeat or palpitations -muscle twitches and spasms -unusually weak or tired Side effects that usually do not require medical attention (report to your doctor or health care professional if they continue or are bothersome): -change in sex drive or performance -depressed mood -diarrhea -difficulty sleeping -headache -menstrual changes -restless or nervous This list may not describe all possible side effects. Call your doctor for medical advice about side effects. You may report side effects to FDA at 1-800-FDA-1088. Where should I keep my medicine? Keep out of the reach of  children. Store at room temperature between 20 and 25 degrees C (68 and 77 degrees F). Protect from light. Keep container tightly closed. Throw away any unused medicine after the expiration date. NOTE: This sheet is a summary. It may not cover all possible information. If you have questions about this medicine, talk to your doctor, pharmacist, or health care provider.  2012, [employer]. (06/12/2008 4:30:05 PM)

## 2012-01-26 NOTE — ED Notes (Signed)
LLQ pain x 2 weeks off and on, at times with nausea but eases off on own, denies any diarrhea or vomiting, denies burning on urination or vaginal discharge, denies fever

## 2012-01-26 NOTE — ED Notes (Signed)
Pt c/o llq pain x 2 weeks.  Denies any n/v/d.  LBM was today.  Denies any urinary problems, denies vaginal bleeding or discharge.

## 2012-01-26 NOTE — ED Provider Notes (Signed)
History     CSN: [id number]  Arrival date & time 01/26/12  1625   First MD Initiated Contact with Patient 01/26/12 1706      Chief Complaint  Patient presents with  . Abdominal Pain    (Consider location/radiation/quality/duration/timing/severity/associated sxs/prior treatment) Patient is a 26 y.o. female presenting with abdominal pain. The history is provided by the patient.  Abdominal Pain The primary symptoms of the illness include abdominal pain.  She has been having intermittent left-sided abdominal pain for the last 2 weeks. Pain radiates around to the left flank. Pain is severe and has been 10 out of 10 at its worst, a 7/10 currently. There's been some mild nausea but no vomiting no diarrhea. She denies fever, chills, sweats. She has some smiled a urinary or frequency, but no urgency or tenesmus or dysuria. She's tried taking ibuprofen with no relief. Pain is worse with palpation. Nothing makes it better.  History reviewed. No pertinent past medical history.  History reviewed. No pertinent past surgical history.  No family history on file.  History  Substance Use Topics  . Smoking status: Current Everyday Smoker  . Smokeless tobacco: Not on file  . Alcohol Use: No    OB History    Grav Para Term Preterm Abortions TAB SAB Ect Mult Living                  Review of Systems  Gastrointestinal: Positive for abdominal pain.  All other systems reviewed and are negative.    Allergies  Review of patient's allergies indicates no known allergies.  Home Medications   Current Outpatient Rx  Name Route Sig Dispense Refill  . ETONOGESTREL 68 MG [place] IMPL Subcutaneous Inject 1 each into the skin once.      BP 114/73  Pulse 95  Temp(Src) 97.6 F (36.4 C) (Oral)  Resp 20  Ht 5\' 4"  (1.626 m)  Wt 110 lb (49.896 kg)  BMI 18.88 kg/m2  SpO2 100%  LMP 12/15/2011  Physical Exam  Nursing note and vitals reviewed.  26 year old female who is resting comfortably and in no  acute distress. Vital signs are normal. Oxygen saturation is 100% which is normal. Head is normocephalic and atraumatic. PERRLA, EOMI. There is no scleral icterus. Oropharynx is clear. Neck is nontender and supple without adenopathy. Back is nontender. Lungs are clear without rales, wheezes, or rhonchi. Heart has regular rate and rhythm without murmur. Abdomen is soft, flat, with moderate tenderness in the left upper quadrant and left lower quadrant with some mild left CVA tenderness. There is no rebound or guarding. There is no hepatosplenomegaly. Peristalsis is diminished. Extremities have full range of motion, no cyanosis or edema. Skin is warm and dry without rash. Neurologic: Mental status is normal, cranial nerves are intact, there no focal motor or sensory deficits.  ED Course  Procedures (including critical care time)  Results for orders placed during the hospital encounter of 01/26/12  URINALYSIS, ROUTINE W REFLEX MICROSCOPIC      Component Value Range   Color, Urine YELLOW  YELLOW    APPearance CLEAR  CLEAR    Specific Gravity, Urine >1.030 (*) 1.005 - 1.030    pH 6.0  5.0 - 8.0    Glucose, UA NEGATIVE  NEGATIVE (mg/dL)   Hgb urine dipstick NEGATIVE  NEGATIVE    Bilirubin Urine NEGATIVE  NEGATIVE    Ketones, ur NEGATIVE  NEGATIVE (mg/dL)   Protein, ur NEGATIVE  NEGATIVE (mg/dL)   Urobilinogen, UA 0.2  0.0 - 1.0 (mg/dL)   Nitrite NEGATIVE  NEGATIVE    Leukocytes, UA NEGATIVE  NEGATIVE   POCT PREGNANCY, URINE      Component Value Range   Preg Test, Ur NEGATIVE  NEGATIVE   CBC      Component Value Range   WBC 6.7  4.0 - 10.5 (K/uL)   RBC 4.06  3.87 - 5.11 (MIL/uL)   Hemoglobin 12.7  12.0 - 15.0 (g/dL)   HCT 09.8  11.9 - 14.7 (%)   MCV 91.9  78.0 - 100.0 (fL)   MCH 31.3  26.0 - 34.0 (pg)   MCHC 34.0  30.0 - 36.0 (g/dL)   RDW 82.9  56.2 - 13.0 (%)   Platelets 253  150 - 400 (K/uL)  DIFFERENTIAL      Component Value Range   Neutrophils Relative 50  43 - 77 (%)   Neutro Abs  3.3  1.7 - 7.7 (K/uL)   Lymphocytes Relative 42  12 - 46 (%)   Lymphs Abs 2.8  0.7 - 4.0 (K/uL)   Monocytes Relative 6  3 - 12 (%)   Monocytes Absolute 0.4  0.1 - 1.0 (K/uL)   Eosinophils Relative 2  0 - 5 (%)   Eosinophils Absolute 0.1  0.0 - 0.7 (K/uL)   Basophils Relative 0  0 - 1 (%)   Basophils Absolute 0.0  0.0 - 0.1 (K/uL)  COMPREHENSIVE METABOLIC PANEL      Component Value Range   Sodium 137  135 - 145 (mEq/L)   Potassium 3.7  3.5 - 5.1 (mEq/L)   Chloride 103  96 - 112 (mEq/L)   CO2 27  19 - 32 (mEq/L)   Glucose, Bld 102 (*) 70 - 99 (mg/dL)   BUN 16  6 - 23 (mg/dL)   Creatinine, Ser 8.65  0.50 - 1.10 (mg/dL)   Calcium 9.5  8.4 - 78.4 (mg/dL)   Total Protein 7.3  6.0 - 8.3 (g/dL)   Albumin 3.6  3.5 - 5.2 (g/dL)   AST 15  0 - 37 (U/L)   ALT 13  0 - 35 (U/L)   Alkaline Phosphatase 52  39 - 117 (U/L)   Total Bilirubin 0.2 (*) 0.3 - 1.2 (mg/dL)   GFR calc non Af Amer >90  >90 (mL/min)   GFR calc Af Amer >90  >90 (mL/min)  LIPASE, BLOOD      Component Value Range   Lipase 24  11 - 59 (U/L)   She is sent home with prescriptions for Percocet, metoclopramide, ciprofloxacin, and metronidazole.  1. Diverticulitis       MDM  Left-sided abdominal pain which most likely is diverticulitis. Although she is young for diverticulitis, distal the most likely cause. She has minimal urinary findings and urinalysis is significant only for high specific gravity. She'll be given IV fluids, IV morphine, IV Zofran and laboratory workup has been initiated.        Dione Booze, MD 01/26/12 2303

## 2012-10-10 ENCOUNTER — Encounter (HOSPITAL_COMMUNITY): Payer: Self-pay

## 2012-10-10 ENCOUNTER — Emergency Department (HOSPITAL_COMMUNITY)
Admission: EM | Admit: 2012-10-10 | Discharge: 2012-10-10 | Disposition: A | Discharge status: Home / Self Care | Payer: Self-pay | Attending: Emergency Medicine | Admitting: Emergency Medicine

## 2012-10-10 ENCOUNTER — Emergency Department (HOSPITAL_COMMUNITY): Payer: Self-pay

## 2012-10-10 DIAGNOSIS — Z3202 Encounter for pregnancy test, result negative: Secondary | ICD-10-CM | POA: Insufficient documentation

## 2012-10-10 DIAGNOSIS — F17200 Nicotine dependence, unspecified, uncomplicated: Secondary | ICD-10-CM | POA: Insufficient documentation

## 2012-10-10 DIAGNOSIS — K5900 Constipation, unspecified: Secondary | ICD-10-CM | POA: Insufficient documentation

## 2012-10-10 DIAGNOSIS — R109 Unspecified abdominal pain: Secondary | ICD-10-CM | POA: Insufficient documentation

## 2012-10-10 DIAGNOSIS — R112 Nausea with vomiting, unspecified: Secondary | ICD-10-CM | POA: Insufficient documentation

## 2012-10-10 LAB — COMPREHENSIVE METABOLIC PANEL
Albumin: 4.2 g/dL (ref 3.5–5.2)
Alkaline Phosphatase: 48 U/L (ref 39–117)
BUN: 11 mg/dL (ref 6–23)
Creatinine, Ser: 0.73 mg/dL (ref 0.50–1.10)
GFR calc Af Amer: >90 mL/min (ref >90)
Glucose, Bld: 100 mg/dL — ABNORMAL HIGH (ref 70–99)
Potassium: 3.3 mEq/L — ABNORMAL LOW (ref 3.5–5.1)
Total Protein: 8.2 g/dL (ref 6.0–8.3)

## 2012-10-10 LAB — URINALYSIS, ROUTINE W REFLEX MICROSCOPIC
Glucose, UA: NEGATIVE mg/dL
Hgb urine dipstick: NEGATIVE
Specific Gravity, Urine: >1.03 — ABNORMAL HIGH (ref 1.005–1.030)
Urobilinogen, UA: 1 mg/dL (ref 0.0–1.0)

## 2012-10-10 LAB — URINE MICROSCOPIC-ADD ON

## 2012-10-10 LAB — LIPASE, BLOOD: Lipase: 12 U/L (ref 11–59)

## 2012-10-10 MED ORDER — BISACODYL 5 MG PO TBEC: 5.0000 mg | DELAYED_RELEASE_TABLET | Freq: Two times a day (BID) | ORAL | Status: DC

## 2012-10-10 MED ORDER — LACTATED RINGERS IV BOLUS (SEPSIS)
2000.0000 mL | Freq: Once | INTRAVENOUS | Status: AC
  Administered 2012-10-10: 2000 mL via INTRAVENOUS

## 2012-10-10 MED ORDER — ONDANSETRON 4 MG PO TBDP
4.0000 mg | ORAL_TABLET | Freq: Once | ORAL | Status: AC
  Administered 2012-10-10: 4 mg via ORAL
  Filled 2012-10-10: qty 1

## 2012-10-10 MED ORDER — POLYETHYLENE GLYCOL 3350 17 GM/SCOOP PO POWD: 17.0000 g | Freq: Two times a day (BID) | ORAL | Status: DC

## 2012-10-10 MED ORDER — IBUPROFEN 400 MG PO TABS: 400.0000 mg | ORAL_TABLET | Freq: Four times a day (QID) | ORAL | Status: DC | PRN

## 2012-10-10 MED ORDER — ONDANSETRON 4 MG PO TBDP: 4.0000 mg | ORAL_TABLET | Freq: Three times a day (TID) | ORAL | Status: DC | PRN

## 2012-10-10 NOTE — ED Notes (Signed)
Pt reports vomiting that began last night.

## 2012-10-10 NOTE — ED Provider Notes (Signed)
History    This chart was scribed for Jones Skene, MD, MD by Smitty Pluck, ED Scribe. The patient was seen in room APA03 and the patient's care was started at 11:37AM.   CSN: [id number]  Arrival date & time 10/10/12  1001      Chief Complaint  Patient presents with  . Emesis    The history is provided by the patient. No language interpreter was used.  [name] is a 26 y.o. female who presents to the Emergency Department complaining of constant, moderate abdominal pain onset 1 day ago. Pt reports the pain is aching. She states that she has vomited multiple times since last night. She does not have appetite due to nausea. Pt states the pain is 10/10. She reports the LMP was 1 month ago. She denies having blood in vomit, fever, chills, vaginal discharge, vaginal bleeding, dysuria, radiation, sick contact and any other pain. Pt reports smoking 4-5 cigarettes/day, smokes marijuana occasionally and drinking alcohol occasionally.    History reviewed. No pertinent past medical history.  History reviewed. No pertinent past surgical history.  No family history on file.  History  Substance Use Topics  . Smoking status: Current Every Day Smoker    Types: Cigarettes  . Smokeless tobacco: Not on file  . Alcohol Use: No    OB History    Grav Para Term Preterm Abortions TAB SAB Ect Mult Living                  Review of Systems At least 10pt or greater review of systems completed and are negative except where specified in the HPI.  Allergies  Review of patient's allergies indicates no known allergies.  Home Medications   Current Outpatient Rx  Name  Route  Sig  Dispense  Refill  . ETONOGESTREL 68 MG [place] IMPL   Subcutaneous   Inject 1 each into the skin once.           BP 108/57  Pulse 86  Temp 97.9 F (36.6 C) (Oral)  Resp 20  Ht 5\' 4"  (1.626 m)  SpO2 100%  LMP 09/04/2012  Physical Exam  Nursing note and vitals reviewed.   Nursing notes reviewed.   Electronic medical record reviewed. VITAL SIGNS:   Filed Vitals:   10/10/12 1024  BP: 108/57  Pulse: 86  Temp: 97.9 F (36.6 C)  TempSrc: Oral  Resp: 20  Height: 5\' 4"  (1.626 m)  SpO2: 100%   CONSTITUTIONAL: Awake, oriented, appears non-toxic HENT: Atraumatic, normocephalic, oral mucosa pink and moist, airway patent. Nares patent without drainage. External ears normal. EYES: Conjunctiva clear, EOMI, PERRLA NECK: Trachea midline, non-tender, supple CARDIOVASCULAR: Normal heart rate, Normal rhythm, No murmurs, rubs, gallops PULMONARY/CHEST: Clear to auscultation, no rhonchi, wheezes, or rales. Symmetrical breath sounds. Non-tender. ABDOMINAL: Non-distended, soft, mildly tender in the left lower quadrant left pelvic area - no rebound or guarding.  BS normal. NEUROLOGIC: Non-focal, moving all four extremities, no gross sensory or motor deficits. EXTREMITIES: No clubbing, cyanosis, or edema SKIN: Warm, Dry, No erythema, No rash  ED Course  Procedures (including critical care time) DIAGNOSTIC STUDIES: Oxygen Saturation is 100% on room air, normal by my interpretation.    COORDINATION OF CARE: 11:55 AM Discussed ED treatment with pt  11:55 AM Ordered:     . [COMPLETED] ondansetron  4 mg Oral Once       Labs Reviewed  URINALYSIS, ROUTINE W REFLEX MICROSCOPIC - Abnormal; Notable for the following:  Specific Gravity, Urine >1.030 (*)     Bilirubin Urine SMALL (*)     Ketones, ur >80 (*)     Protein, ur TRACE (*)     All other components within normal limits  COMPREHENSIVE METABOLIC PANEL - Abnormal; Notable for the following:    Potassium 3.3 (*)     Glucose, Bld 100 (*)     All other components within normal limits  URINE MICROSCOPIC-ADD ON - Abnormal; Notable for the following:    Squamous Epithelial / LPF MANY (*)     All other components within normal limits  PREGNANCY, URINE  LIPASE, BLOOD   Dg Abd 2 Views  10/10/2012  *RADIOLOGY REPORT*  Clinical Data:  Abdominal pain with vomiting  ABDOMEN - 2 VIEW  Comparison:  November 24, 2007  Findings:  Supine and upright abdomen images were obtained.  There are clips in the gallbladder fossa region.  There is moderate stool in the colon.  There is no evidence suggesting obstruction or free air on this study.  Bowel gas pattern is unremarkable.  There are no abnormal calcifications.  IMPRESSION: Unremarkable gas pattern.  No obstruction or free air identified.   Original Report Authenticated By: Bretta Bang, M.D.      1. Nausea and vomiting   2. Constipation       MDM  [name] is a 26 y.o. female presenting with nausea and lower abdominal/pelvic pain. Pain seems to be worse in the left lower quadrant. Patient is not having any urinary tract symptoms, she's not having a vaginal discharge. UA is unremarkable, CMP is unremarkable, pregnancy test is negative. UA does not indicate urinalysis just some mild dehydration-patient has been getting IV fluids and Zofran. Abdomen x-ray shows an unremarkable gas pattern as interpreted by radiology-[facility] quite a bit of formed stool in the left side of the colon as well as in the sigmoid. Patient says she's not sure the last time she "had a stinky" and prescribed her Dulcolax and MiraLax for constipation as well.  Patient is feeling much better after fluids and Zofran will be discharged home with Zofran and a bowel regimen. Do not think patient has an acute surgical, or otherwise emergent intra-abdominal or intrapelvic emergency at this time.  I explained the diagnosis and have given explicit precautions to return to the ER including worsening pain failure to resolve with medication, worsening vomiting or any other new or worsening symptoms. The patient understands and accepts the medical plan as it's been dictated and I have answered their questions. Discharge instructions concerning home care and prescriptions have been given.  The patient is STABLE and is  discharged to home in good condition.  I personally performed the services described in this documentation, which was scribed in my presence. The recorded information has been reviewed and is accurate. Jones Skene, M.D.      Jones Skene, MD 10/10/12 [phone]

## 2015-07-21 ENCOUNTER — Encounter (HOSPITAL_COMMUNITY): Payer: Self-pay | Admitting: *Deleted

## 2015-07-21 ENCOUNTER — Emergency Department (HOSPITAL_COMMUNITY): Payer: Self-pay

## 2015-07-21 ENCOUNTER — Emergency Department (HOSPITAL_COMMUNITY)
Admission: EM | Admit: 2015-07-21 | Discharge: 2015-07-22 | Disposition: A | Discharge status: Home / Self Care | Payer: Self-pay | Attending: Emergency Medicine | Admitting: Emergency Medicine

## 2015-07-21 DIAGNOSIS — N200 Calculus of kidney: Secondary | ICD-10-CM | POA: Insufficient documentation

## 2015-07-21 DIAGNOSIS — Z720 Tobacco use: Secondary | ICD-10-CM | POA: Insufficient documentation

## 2015-07-21 DIAGNOSIS — K529 Noninfective gastroenteritis and colitis, unspecified: Secondary | ICD-10-CM | POA: Insufficient documentation

## 2015-07-21 DIAGNOSIS — R51 Headache: Secondary | ICD-10-CM | POA: Insufficient documentation

## 2015-07-21 DIAGNOSIS — Z3202 Encounter for pregnancy test, result negative: Secondary | ICD-10-CM | POA: Insufficient documentation

## 2015-07-21 LAB — COMPREHENSIVE METABOLIC PANEL WITH GFR
ALT: 23 U/L (ref 14–54)
AST: 24 U/L (ref 15–41)
Albumin: 4.3 g/dL (ref 3.5–5.0)
Alkaline Phosphatase: 44 U/L (ref 38–126)
Anion gap: 9 (ref 5–15)
BUN: 16 mg/dL (ref 6–20)
CO2: 23 mmol/L (ref 22–32)
Calcium: 9 mg/dL (ref 8.9–10.3)
Chloride: 107 mmol/L (ref 101–111)
Creatinine, Ser: 0.76 mg/dL (ref 0.44–1.00)
GFR calc Af Amer: >60 mL/min
GFR calc non Af Amer: >60 mL/min
Glucose, Bld: 88 mg/dL (ref 65–99)
Potassium: 3.3 mmol/L — ABNORMAL LOW (ref 3.5–5.1)
Sodium: 139 mmol/L (ref 135–145)
Total Bilirubin: 0.6 mg/dL (ref 0.3–1.2)
Total Protein: 8 g/dL (ref 6.5–8.1)

## 2015-07-21 LAB — CBC WITH DIFFERENTIAL/PLATELET
Basophils Absolute: 0 K/uL (ref 0.0–0.1)
Basophils Relative: 0 %
Eosinophils Absolute: 0.1 K/uL (ref 0.0–0.7)
Eosinophils Relative: 1 %
HCT: 35.7 % — ABNORMAL LOW (ref 36.0–46.0)
Hemoglobin: 12.3 g/dL (ref 12.0–15.0)
Lymphocytes Relative: 21 %
Lymphs Abs: 3.9 K/uL (ref 0.7–4.0)
MCH: 31.2 pg (ref 26.0–34.0)
MCHC: 34.5 g/dL (ref 30.0–36.0)
MCV: 90.6 fL (ref 78.0–100.0)
Monocytes Absolute: 0.9 K/uL (ref 0.1–1.0)
Monocytes Relative: 5 %
Neutro Abs: 13.5 K/uL — ABNORMAL HIGH (ref 1.7–7.7)
Neutrophils Relative %: 73 %
Platelets: 348 K/uL (ref 150–400)
RBC: 3.94 MIL/uL (ref 3.87–5.11)
RDW: 12.3 % (ref 11.5–15.5)
WBC: 18.4 K/uL — ABNORMAL HIGH (ref 4.0–10.5)

## 2015-07-21 LAB — ETHANOL: Alcohol, Ethyl (B): 11 mg/dL — ABNORMAL HIGH (ref <5)

## 2015-07-21 LAB — LIPASE, BLOOD: Lipase: 11 U/L — ABNORMAL LOW (ref 22–51)

## 2015-07-21 MED ORDER — IOHEXOL 300 MG/ML  SOLN
50.0000 mL | Freq: Once | INTRAMUSCULAR | Status: AC | PRN
Start: 2015-07-21 — End: 2015-07-21
  Administered 2015-07-21: 50 mL via ORAL

## 2015-07-21 MED ORDER — PROMETHAZINE HCL 25 MG/ML IJ SOLN
12.5000 mg | Freq: Once | INTRAMUSCULAR | Status: AC
  Administered 2015-07-21: 12.5 mg via INTRAVENOUS
  Filled 2015-07-21: qty 1

## 2015-07-21 MED ORDER — SODIUM CHLORIDE 0.9 % IV SOLN
INTRAVENOUS | Status: AC
  Administered 2015-07-21: 23:00:00 via INTRAVENOUS

## 2015-07-21 NOTE — ED Notes (Signed)
Neese-NP at bedside at this time.

## 2015-07-21 NOTE — ED Notes (Signed)
Pt c/o abdominal pain and vomiting that started x 2 hours ago

## 2015-07-21 NOTE — ED Provider Notes (Signed)
CSN: [id number]     Arrival date & time 07/21/15  2213 History   First MD Initiated Contact with Patient 07/21/15 2223     Chief Complaint  Patient presents with  . Abdominal Pain     (Consider location/radiation/quality/duration/timing/severity/associated sxs/prior Treatment) Patient is a 29 y.o. female presenting with abdominal pain. The history is provided by the patient.  Abdominal Pain Pain location:  Generalized Pain quality: sharp and stabbing   Pain radiates to:  Does not radiate Pain severity:  Severe Onset quality:  Gradual Timing:  Constant Progression:  Worsening Chronicity:  New Context: alcohol use   Context: not sick contacts   Relieved by:  None tried Worsened by:  Nothing tried Associated symptoms: anorexia, nausea and vomiting   Associated symptoms: no chest pain, no chills, no constipation, no diarrhea, no dysuria, no fever, no hematuria, no shortness of breath, no vaginal bleeding and no vaginal discharge   Risk factors: no aspirin use  Alcohol abuse: hx of     [name] is a 29 y.o. female who presents to the ED with abdominal pain, nausea and vomiting that started approximately 2 hours prior to arrival to the ED. LMP 2 months ago but has implant for [facility]. Patient states she drank beer at 2 pm and has a hx of ETOH abuse but does not drink now like she did a year ago.   History reviewed. No pertinent past medical history. History reviewed. No pertinent past surgical history. History reviewed. No pertinent family history. Social History  Substance Use Topics  . Smoking status: Current Every Day Smoker -- 0.50 packs/day    Types: Cigarettes  . Smokeless tobacco: None  . Alcohol Use: No   OB History    No data available     Review of Systems  Constitutional: Negative for fever and chills.  Respiratory: Negative for shortness of breath.   Cardiovascular: Negative for chest pain.  Gastrointestinal: Positive for nausea, vomiting, abdominal pain and  anorexia. Negative for diarrhea and constipation.  Genitourinary: Negative for dysuria, urgency, hematuria, flank pain, vaginal bleeding and vaginal discharge.  Musculoskeletal: Positive for back pain.  Skin: Negative for rash.  Neurological: Positive for headaches. Negative for syncope.  Psychiatric/Behavioral: Negative for confusion. The patient is not nervous/anxious.       Allergies  Review of patient's allergies indicates no known allergies.  Home Medications   Prior to Admission medications   Medication Sig Start Date End Date Taking? Authorizing Provider  etonogestrel (IMPLANON) 68 MG IMPL implant Inject 1 each into the skin once.    Historical Provider, MD  HYDROcodone-acetaminophen (NORCO/VICODIN) 5-325 MG per tablet Take 1 tablet by mouth every 4 (four) hours as needed. 07/22/15   Hope Orlene Och, NP  omeprazole (PRILOSEC) 20 MG capsule Take 1 capsule (20 mg total) by mouth daily. 07/22/15   Hope Orlene Och, NP  promethazine (PHENERGAN) 25 MG tablet Take 1 tablet (25 mg total) by mouth every 6 (six) hours as needed for nausea or vomiting. 07/22/15   Hope Orlene Och, NP   BP 114/69 mmHg  Pulse 88  Temp(Src) 98 F (36.7 C) (Oral)  Resp 16  Ht  (1.626 m)  Wt 110 lb (49.896 kg)  BMI 18.87 kg/m2  SpO2 100%  LMP  (LMP Unknown) Physical Exam  Constitutional: She is oriented to person, place, and time. She appears well-developed and well-nourished.  HENT:  Head: Normocephalic and atraumatic.  Eyes: Conjunctivae and EOM are normal. Pupils are equal,  round, and reactive to light.  Neck: Normal range of motion. Neck supple.  Cardiovascular: Normal rate and regular rhythm.   Pulmonary/Chest: Effort normal. No respiratory distress. She has no wheezes. She has no rales.  Abdominal: Soft. Bowel sounds are normal. There is generalized tenderness.  Genitourinary:  External genitalia without lesions, white d/c vaginal vault, positive CMT, bilateral adnexal tenderness, uterus without  palpable enlargement.   Musculoskeletal: Normal range of motion.  Neurological: She is alert and oriented to person, place, and time. No cranial nerve deficit.  Skin: Skin is warm and dry.  Psychiatric: She has a normal mood and affect. Her behavior is normal.  Nursing note and vitals reviewed.   ED Course  Procedures (including critical care time) Labs Review Results for orders placed or performed during the hospital encounter of 07/21/15 (from the past 24 hour(s))  Comprehensive metabolic panel     Status: Abnormal   Collection Time: 07/21/15 10:40 PM  Result Value Ref Range   Sodium 139 135 - 145 mmol/L   Potassium 3.3 (L) 3.5 - 5.1 mmol/L   Chloride 107 101 - 111 mmol/L   CO2 23 22 - 32 mmol/L   Glucose, Bld 88 65 - 99 mg/dL   BUN 16 6 - 20 mg/dL   Creatinine, Ser 1.61 0.44 - 1.00 mg/dL   Calcium 9.0 8.9 - 09.6 mg/dL   Total Protein 8.0 6.5 - 8.1 g/dL   Albumin 4.3 3.5 - 5.0 g/dL   AST 24 15 - 41 U/L   ALT 23 14 - 54 U/L   Alkaline Phosphatase 44 38 - 126 U/L   Total Bilirubin 0.6 0.3 - 1.2 mg/dL   GFR calc non Af Amer >60 >60 mL/min   GFR calc Af Amer >60 >60 mL/min   Anion gap 9 5 - 15  CBC with Differential     Status: Abnormal   Collection Time: 07/21/15 10:40 PM  Result Value Ref Range   WBC 18.4 (H) 4.0 - 10.5 K/uL   RBC 3.94 3.87 - 5.11 MIL/uL   Hemoglobin 12.3 12.0 - 15.0 g/dL   HCT 04.5 (L) 40.9 - 81.1 %   MCV 90.6 78.0 - 100.0 fL   MCH 31.2 26.0 - 34.0 pg   MCHC 34.5 30.0 - 36.0 g/dL   RDW 91.4 78.2 - 95.6 %   Platelets 348 150 - 400 K/uL   Neutrophils Relative % 73 %   Neutro Abs 13.5 (H) 1.7 - 7.7 K/uL   Lymphocytes Relative 21 %   Lymphs Abs 3.9 0.7 - 4.0 K/uL   Monocytes Relative 5 %   Monocytes Absolute 0.9 0.1 - 1.0 K/uL   Eosinophils Relative 1 %   Eosinophils Absolute 0.1 0.0 - 0.7 K/uL   Basophils Relative 0 %   Basophils Absolute 0.0 0.0 - 0.1 K/uL  Lipase, blood     Status: Abnormal   Collection Time: 07/21/15 10:40 PM  Result Value Ref  Range   Lipase 11 (L) 22 - 51 U/L  Ethanol     Status: Abnormal   Collection Time: 07/21/15 10:40 PM  Result Value Ref Range   Alcohol, Ethyl (B) 11 (H) <5 mg/dL  Urinalysis, Routine w reflex microscopic (not at [facility])     Status: Abnormal   Collection Time: 07/22/15 12:20 AM  Result Value Ref Range   Color, Urine YELLOW YELLOW   APPearance CLEAR CLEAR   Specific Gravity, Urine >1.030 (H) 1.005 - 1.030   pH 6.0 5.0 -  8.0   Glucose, UA NEGATIVE NEGATIVE mg/dL   Hgb urine dipstick MODERATE (A) NEGATIVE   Bilirubin Urine NEGATIVE NEGATIVE   Ketones, ur NEGATIVE NEGATIVE mg/dL   Protein, ur NEGATIVE NEGATIVE mg/dL   Urobilinogen, UA 0.2 0.0 - 1.0 mg/dL   Nitrite NEGATIVE NEGATIVE   Leukocytes, UA NEGATIVE NEGATIVE  Wet prep, genital     Status: Abnormal   Collection Time: 07/22/15 12:20 AM  Result Value Ref Range   Yeast Wet Prep HPF POC NONE SEEN NONE SEEN   Trich, Wet Prep NONE SEEN NONE SEEN   Clue Cells Wet Prep HPF POC MANY (A) NONE SEEN   WBC, Wet Prep HPF POC FEW (A) NONE SEEN  Urine microscopic-add on     Status: Abnormal   Collection Time: 07/22/15 12:20 AM  Result Value Ref Range   Squamous Epithelial / LPF MANY (A) RARE   WBC, UA 0-2 <3 WBC/hpf   RBC / HPF TOO NUMEROUS TO COUNT <3 RBC/hpf   Bacteria, UA MANY (A) RARE   Urine-Other MUCOUS PRESENT   POC urine preg, ED (not at [facility])     Status: None   Collection Time: 07/22/15 12:24 AM  Result Value Ref Range   Preg Test, Ur NEGATIVE NEGATIVE     Imaging Review Ct Abdomen Pelvis W Contrast  07/22/2015   CLINICAL DATA:  Abdominal pain all over, nausea, and vomiting beginning the yesterday, leukocytosis, smoker  EXAM: CT ABDOMEN AND PELVIS WITH CONTRAST  TECHNIQUE: Multidetector CT imaging of the abdomen and pelvis was performed using the standard protocol following bolus administration of intravenous contrast. Sagittal and coronal MPR images reconstructed from axial data set.  CONTRAST:  50mL OMNIPAQUE IOHEXOL 300 MG/ML  SOLN PO, OMNIPAQUE IOHEXOL 300 MG/ML SOLN IV  COMPARISON:  None  FINDINGS: Lung bases clear.  Gallbladder surgically absent.  Multiple nonobstructing RIGHT renal calculi largest 5 mm diameter.  Liver, spleen, pancreas, kidneys, and adrenal glands otherwise normal.  Small amount of free fluid in pelvis.  Normal appearing uterus, ovaries, bladder, and ureters.  Normal appendix along RIGHT lateral pelvic sidewall.  Stomach and bowel loops grossly unremarkable.  No mass, adenopathy, free air or hernia.  Small amount of air in vagina.  No acute osseous findings.  IMPRESSION: Small amount of nonspecific free pelvic fluid.  Nonobstructing RIGHT renal calculi.  Otherwise negative exam.   Electronically Signed   By: Ulyses Southward M.D.   On: 07/22/2015 02:20   I have personally reviewed and evaluated these images and lab results as part of my medical decision-making.   MDM  29 y.o. female with nausea/vomiting and hematuria. Stable for d/c and feeling better after IV fluids and medication for nausea. Discussed with the patient clinical, lab and x-ray findings and plan of care. All questioned fully answered. She will follow up with Urology or return here as needed if any problems arise. Rx for Phenergan, Prilosec and Hydrocodone.   Final diagnoses:  Gastroenteritis  Renal calculi       Janne Napoleon, NP 07/22/15 0234  Rolland Porter, MD 07/23/15 1515

## 2015-07-22 LAB — URINALYSIS, ROUTINE W REFLEX MICROSCOPIC
BILIRUBIN URINE: NEGATIVE
Glucose, UA: NEGATIVE mg/dL
Ketones, ur: NEGATIVE mg/dL
Leukocytes, UA: NEGATIVE
Nitrite: NEGATIVE
PH: 6 (ref 5.0–8.0)
Protein, ur: NEGATIVE mg/dL
Urobilinogen, UA: 0.2 mg/dL (ref 0.0–1.0)

## 2015-07-22 LAB — URINE MICROSCOPIC-ADD ON

## 2015-07-22 LAB — WET PREP, GENITAL
Trich, Wet Prep: NONE SEEN
Yeast Wet Prep HPF POC: NONE SEEN

## 2015-07-22 LAB — POC URINE PREG, ED: Preg Test, Ur: NEGATIVE

## 2015-07-22 MED ORDER — OMEPRAZOLE 20 MG PO CPDR: 20.0000 mg | DELAYED_RELEASE_CAPSULE | Freq: Every day | ORAL | Status: DC

## 2015-07-22 MED ORDER — PROMETHAZINE HCL 25 MG PO TABS: 25.0000 mg | ORAL_TABLET | Freq: Four times a day (QID) | ORAL | Status: DC | PRN

## 2015-07-22 MED ORDER — FAMOTIDINE IN NACL 20-0.9 MG/50ML-% IV SOLN
20.0000 mg | Freq: Once | INTRAVENOUS | Status: AC
  Administered 2015-07-22: 20 mg via INTRAVENOUS

## 2015-07-22 MED ORDER — HYDROCODONE-ACETAMINOPHEN 5-325 MG PO TABS: 1.0000 | ORAL_TABLET | ORAL | Status: DC | PRN

## 2015-07-22 MED ORDER — IOHEXOL 300 MG/ML  SOLN
100.0000 mL | Freq: Once | INTRAMUSCULAR | Status: AC | PRN
  Administered 2015-07-22: 100 mL via INTRAVENOUS

## 2015-07-22 MED ORDER — FAMOTIDINE IN NACL 20-0.9 MG/50ML-% IV SOLN
INTRAVENOUS | Status: AC
  Filled 2015-07-22: qty 50

## 2015-07-22 NOTE — Discharge Instructions (Signed)
Call for follow up with the Urologist. Return here as needed.

## 2015-07-22 NOTE — ED Notes (Signed)
Pt assisted on the bedpan and off, urine specimen obtained and taken to lab;

## 2015-07-23 LAB — GC/CHLAMYDIA PROBE AMP (~~LOC~~) NOT AT ARMC
CHLAMYDIA, DNA PROBE: NEGATIVE
NEISSERIA GONORRHEA: NEGATIVE

## 2015-09-11 ENCOUNTER — Emergency Department (HOSPITAL_COMMUNITY): Payer: Self-pay

## 2015-09-11 ENCOUNTER — Emergency Department (HOSPITAL_COMMUNITY)
Admission: EM | Admit: 2015-09-11 | Discharge: 2015-09-11 | Disposition: A | Discharge status: Home / Self Care | Payer: Self-pay | Attending: Emergency Medicine | Admitting: Emergency Medicine

## 2015-09-11 ENCOUNTER — Encounter (HOSPITAL_COMMUNITY): Payer: Self-pay | Admitting: Emergency Medicine

## 2015-09-11 DIAGNOSIS — Z720 Tobacco use: Secondary | ICD-10-CM | POA: Insufficient documentation

## 2015-09-11 DIAGNOSIS — W108XXA Fall (on) (from) other stairs and steps, initial encounter: Secondary | ICD-10-CM | POA: Insufficient documentation

## 2015-09-11 DIAGNOSIS — Y9289 Other specified places as the place of occurrence of the external cause: Secondary | ICD-10-CM | POA: Insufficient documentation

## 2015-09-11 DIAGNOSIS — S20211A Contusion of right front wall of thorax, initial encounter: Secondary | ICD-10-CM | POA: Insufficient documentation

## 2015-09-11 DIAGNOSIS — Z79899 Other long term (current) drug therapy: Secondary | ICD-10-CM | POA: Insufficient documentation

## 2015-09-11 DIAGNOSIS — Y998 Other external cause status: Secondary | ICD-10-CM | POA: Insufficient documentation

## 2015-09-11 DIAGNOSIS — R197 Diarrhea, unspecified: Secondary | ICD-10-CM | POA: Insufficient documentation

## 2015-09-11 DIAGNOSIS — R112 Nausea with vomiting, unspecified: Secondary | ICD-10-CM | POA: Insufficient documentation

## 2015-09-11 DIAGNOSIS — Y9301 Activity, walking, marching and hiking: Secondary | ICD-10-CM | POA: Insufficient documentation

## 2015-09-11 LAB — I-STAT CHEM 8, ED
BUN: 9 mg/dL (ref 6–20)
CREATININE: 1.2 mg/dL — AB (ref 0.44–1.00)
Calcium, Ion: 1.15 mmol/L (ref 1.12–1.23)
Chloride: 105 mmol/L (ref 101–111)
Glucose, Bld: 109 mg/dL — ABNORMAL HIGH (ref 65–99)
HEMATOCRIT: 39 % (ref 36.0–46.0)
HEMOGLOBIN: 13.3 g/dL (ref 12.0–15.0)
POTASSIUM: 3.8 mmol/L (ref 3.5–5.1)
Sodium: 141 mmol/L (ref 135–145)
TCO2: 23 mmol/L (ref 0–100)

## 2015-09-11 MED ORDER — HYDROCODONE-ACETAMINOPHEN 5-325 MG PO TABS: ORAL_TABLET | ORAL | Status: DC

## 2015-09-11 MED ORDER — SODIUM CHLORIDE 0.9 % IV BOLUS (SEPSIS)
1000.0000 mL | Freq: Once | INTRAVENOUS | Status: AC
  Administered 2015-09-11: 1000 mL via INTRAVENOUS

## 2015-09-11 MED ORDER — IBUPROFEN 600 MG PO TABS: 600.0000 mg | ORAL_TABLET | Freq: Four times a day (QID) | ORAL | Status: DC | PRN

## 2015-09-11 MED ORDER — ONDANSETRON HCL 4 MG/2ML IJ SOLN
4.0000 mg | Freq: Once | INTRAMUSCULAR | Status: AC
  Administered 2015-09-11: 4 mg via INTRAVENOUS
  Filled 2015-09-11: qty 2

## 2015-09-11 MED ORDER — HYDROCODONE-ACETAMINOPHEN 5-325 MG PO TABS
1.0000 | ORAL_TABLET | Freq: Once | ORAL | Status: AC
  Administered 2015-09-11: 1 via ORAL
  Filled 2015-09-11: qty 1

## 2015-09-11 NOTE — ED Notes (Signed)
Patient d/c papers given and reviewed. Patient verbalized understanding.  

## 2015-09-11 NOTE — ED Provider Notes (Signed)
CSN: [id number]     Arrival date & time 09/11/15  [id number] History   First MD Initiated Contact with Patient 09/11/15 0809     Chief Complaint  Patient presents with  . Emesis     (Consider location/radiation/quality/duration/timing/severity/associated sxs/prior Treatment) HPI  [name] is a 29 y.o. female who presents to the Emergency Department complaining of right rib pain that began secondary to a fall that occurred yesterday morning.  She states that she tripped while walking her child to the bus and fell over a piece of wood.  She complains of pain to her right ribs with movement and deep breathing.  She also states that she had three episodes of vomiting and some loose stools yesterday evening.  Unable to keep down food or fluids yesterday or today.  She denies fever, shortness of breath, abdominal pain, dysuria, bloody vomitus or stools.  Also denies head injury or LOC  History reviewed. No pertinent past medical history. History reviewed. No pertinent past surgical history. History reviewed. No pertinent family history. Social History  Substance Use Topics  . Smoking status: Current Every Day Smoker -- 0.50 packs/day    Types: Cigarettes  . Smokeless tobacco: None  . Alcohol Use: No   OB History    Gravida Para Term Preterm AB TAB SAB Ectopic Multiple Living            2     Review of Systems  Constitutional: Positive for appetite change. Negative for fever and chills.  Respiratory: Negative for chest tightness and shortness of breath.   Cardiovascular: Positive for chest pain (right rib pain).  Gastrointestinal: Positive for nausea, vomiting and diarrhea. Negative for abdominal pain, blood in stool and abdominal distention.  Genitourinary: Negative for dysuria and difficulty urinating.  Musculoskeletal: Positive for joint swelling and arthralgias.  Skin: Negative for color change and wound.  Neurological: Negative for dizziness, syncope, weakness and  light-headedness.  All other systems reviewed and are negative.     Allergies  Review of patient's allergies indicates no known allergies.  Home Medications   Prior to Admission medications   Medication Sig Start Date End Date Taking? Authorizing Provider  etonogestrel (IMPLANON) 68 MG IMPL implant Inject 1 each into the skin once.    Historical Provider, MD  HYDROcodone-acetaminophen (NORCO/VICODIN) 5-325 MG per tablet Take 1 tablet by mouth every 4 (four) hours as needed. 07/22/15   Hope Orlene OchM Neese, NP  omeprazole (PRILOSEC) 20 MG capsule Take 1 capsule (20 mg total) by mouth daily. 07/22/15   Hope Orlene OchM Neese, NP  promethazine (PHENERGAN) 25 MG tablet Take 1 tablet (25 mg total) by mouth every 6 (six) hours as needed for nausea or vomiting. 07/22/15   Hope Orlene OchM Neese, NP   BP 117/62 mmHg  Pulse 81  Temp(Src) 98.1 F (36.7 C) (Oral)  Resp 18  Ht 5\' 4"  (1.626 m)  Wt 110 lb (49.896 kg)  BMI 18.87 kg/m2  SpO2 100%  LMP 08/28/2015 Physical Exam  Constitutional: She is oriented to person, place, and time. She appears well-developed and well-nourished.  HENT:  Head: Normocephalic.  Mouth/Throat: Uvula is midline and oropharynx is clear and moist. Mucous membranes are dry.  Eyes: No scleral icterus.  Neck: Normal range of motion. Neck supple.  Cardiovascular: Normal rate and regular rhythm.   No murmur heard. Pulmonary/Chest: Effort normal and breath sounds normal. No respiratory distress. She exhibits tenderness (localized ttp of the lateral right ribs.  no abrasions, ecchymosis or crepitus).  Abdominal: Soft. She exhibits no distension. There is no tenderness. There is no rebound and no guarding.  Neurological: She is alert and oriented to person, place, and time. Coordination normal.  Skin: Skin is warm.  Nursing note and vitals reviewed.   ED Course  Procedures (including critical care time) Labs Review Labs Reviewed  I-STAT CHEM 8, ED - Abnormal; Notable for the following:     Creatinine, Ser 1.20 (*)    Glucose, Bld 109 (*)    All other components within normal limits    Imaging Review Dg Ribs Unilateral W/chest Right  09/11/2015  CLINICAL DATA:  29 year old female post fall down stairs yesterday with right rib pain. Initial encounter. EXAM: RIGHT RIBS AND CHEST - 3+ VIEW COMPARISON:  11/24/2007 chest x-ray. FINDINGS: No evidence of right-sided rib fracture or pneumothorax. The slightly widened appearance of the right second rib is without change and may be congenital or related to remote injury. Slight scoliosis of the thoracic spine. Lungs clear. Heart size within normal limits. IMPRESSION: No evidence of right-sided rib fracture or pneumothorax. The slightly widened appearance of the right second rib is without change and may be congenital or related to remote injury. Slight scoliosis of the thoracic spine. Electronically Signed   By: Lacy Duverney M.D.   On: 09/11/2015 09:07   I have personally reviewed and evaluated these images and lab results as part of my medical decision-making.    MDM   Final diagnoses:  Non-intractable vomiting with nausea, vomiting of unspecified type  Rib contusion, right, initial encounter   9:57 AM patient has received anti-emetic and IVF's, she states she is feeling much better and requesting to go home. Tolerating by mouth fluid.  Abdomen remains soft and nontender. No evidence of rib fracture on imaging, but discussed possibility of occult fx. Patient agrees to symptomatic treatment and small amounts of fluids today. Vomiting is felt to be related to viral illness. She agrees to follow-up with her primary care physician or return here for any worsening symptoms. No concerning symptoms for acute abdomen.     Pauline Aus, PA-C 09/11/15 1004  Donnetta Hutching, MD 09/11/15 1218

## 2015-09-11 NOTE — ED Notes (Signed)
PT c/o nausea with vomiting x3 yesterday but denies any abdominal pain. PT also states she slipped and fell on her outdoor front steps yesterday landing on her right side and c/o pain to rib cage on right side but denies any SOB.

## 2015-09-11 NOTE — Discharge Instructions (Signed)
Nausea and Vomiting  Nausea means you feel sick to your stomach. Throwing up (vomiting) is a reflex where stomach contents come out of your mouth.  HOME CARE   · Take medicine as told by your doctor.  · Do not force yourself to eat. However, you do need to drink fluids.  · If you feel like eating, eat a normal diet as told by your doctor.    Eat rice, wheat, potatoes, bread, lean meats, yogurt, fruits, and vegetables.    Avoid high-fat foods.  · Drink enough fluids to keep your pee (urine) clear or pale yellow.  · Ask your doctor how to replace body fluid losses (rehydrate). Signs of body fluid loss (dehydration) include:    Feeling very thirsty.    Dry lips and mouth.    Feeling dizzy.    Dark pee.    Peeing less than normal.    Feeling confused.    Fast breathing or heart rate.  GET HELP RIGHT AWAY IF:   · You have blood in your throw up.  · You have black or bloody poop (stool).  · You have a bad headache or stiff neck.  · You feel confused.  · You have bad belly (abdominal) pain.  · You have chest pain or trouble breathing.  · You do not pee at least once every 8 hours.  · You have cold, clammy skin.  · You keep throwing up after 24 to 48 hours.  · You have a fever.  MAKE SURE YOU:   · Understand these instructions.  · Will watch your condition.  · Will get help right away if you are not doing well or get worse.     This information is not intended to replace advice given to you by your health care provider. Make sure you discuss any questions you have with your health care provider.     Document Released: 04/05/2008 Document Revised: 01/10/2012 Document Reviewed: 03/19/2011  Elsevier Interactive Patient Education ©2016 [employer]

## 2015-11-24 ENCOUNTER — Encounter (HOSPITAL_COMMUNITY): Payer: Self-pay | Admitting: *Deleted

## 2015-11-24 ENCOUNTER — Emergency Department (HOSPITAL_COMMUNITY)
Admission: EM | Admit: 2015-11-24 | Discharge: 2015-11-24 | Disposition: A | Discharge status: Home / Self Care | Payer: Self-pay | Attending: Emergency Medicine | Admitting: Emergency Medicine

## 2015-11-24 DIAGNOSIS — F17210 Nicotine dependence, cigarettes, uncomplicated: Secondary | ICD-10-CM | POA: Insufficient documentation

## 2015-11-24 DIAGNOSIS — L738 Other specified follicular disorders: Secondary | ICD-10-CM

## 2015-11-24 DIAGNOSIS — L089 Local infection of the skin and subcutaneous tissue, unspecified: Secondary | ICD-10-CM | POA: Insufficient documentation

## 2015-11-24 DIAGNOSIS — B999 Unspecified infectious disease: Secondary | ICD-10-CM

## 2015-11-24 DIAGNOSIS — L0102 Bockhart's impetigo: Secondary | ICD-10-CM | POA: Insufficient documentation

## 2015-11-24 MED ORDER — ONDANSETRON HCL 4 MG PO TABS
4.0000 mg | ORAL_TABLET | Freq: Once | ORAL | Status: AC
  Administered 2015-11-24: 4 mg via ORAL
  Filled 2015-11-24: qty 1

## 2015-11-24 MED ORDER — HYDROCODONE-ACETAMINOPHEN 5-325 MG PO TABS: 1.0000 | ORAL_TABLET | ORAL | Status: DC | PRN

## 2015-11-24 MED ORDER — IBUPROFEN 800 MG PO TABS
800.0000 mg | ORAL_TABLET | Freq: Once | ORAL | Status: AC
  Administered 2015-11-24: 800 mg via ORAL
  Filled 2015-11-24: qty 1

## 2015-11-24 MED ORDER — DOXYCYCLINE HYCLATE 100 MG PO TABS
100.0000 mg | ORAL_TABLET | Freq: Once | ORAL | Status: AC
  Administered 2015-11-24: 100 mg via ORAL
  Filled 2015-11-24: qty 1

## 2015-11-24 MED ORDER — IBUPROFEN 600 MG PO TABS: 600.0000 mg | ORAL_TABLET | Freq: Four times a day (QID) | ORAL | Status: DC | PRN

## 2015-11-24 MED ORDER — SULFAMETHOXAZOLE-TRIMETHOPRIM 800-160 MG PO TABS: 1.0000 | ORAL_TABLET | Freq: Two times a day (BID) | ORAL | Status: AC

## 2015-11-24 MED ORDER — HYDROCODONE-ACETAMINOPHEN 5-325 MG PO TABS
2.0000 | ORAL_TABLET | Freq: Once | ORAL | Status: AC
  Administered 2015-11-24: 2 via ORAL
  Filled 2015-11-24: qty 2

## 2015-11-24 NOTE — Discharge Instructions (Signed)
Your examination is consistent with an infected folliculitis. Please use warm Epsom salt soaks 2 times daily over the next 5-7 days. Please use Septra 2 times daily with food, ibuprofen every 6 hours for inflammation and for discomfort. May use Norco for more severe pain. Norco may cause drowsiness, please use with caution. Please see Dr. Ledell Peoples in 1-2 weeks for recheck of this issue. Folliculitis Folliculitis is redness, soreness, and swelling (inflammation) of the hair follicles. This condition can occur anywhere on the body. People with weakened immune systems, diabetes, or obesity have a greater risk of getting folliculitis. CAUSES  Bacterial infection. This is the most common cause.  Fungal infection.  Viral infection.  Contact with certain chemicals, especially oils and tars. Long-term folliculitis can result from bacteria that live in the nostrils. The bacteria may trigger multiple outbreaks of folliculitis over time. SYMPTOMS Folliculitis most commonly occurs on the scalp, thighs, legs, back, buttocks, and areas where hair is shaved frequently. An early sign of folliculitis is a small, white or yellow, pus-filled, itchy lesion (pustule). These lesions appear on a red, inflamed follicle. They are usually less than 0.2 inches (5 mm) wide. When there is an infection of the follicle that goes deeper, it becomes a boil or furuncle. A group of closely packed boils creates a larger lesion (carbuncle). Carbuncles tend to occur in hairy, sweaty areas of the body. DIAGNOSIS  Your caregiver can usually tell what is wrong by doing a physical exam. A sample may be taken from one of the lesions and tested in a lab. This can help determine what is causing your folliculitis. TREATMENT  Treatment may include:  Applying warm compresses to the affected areas.  Taking antibiotic medicines orally or applying them to the skin.  Draining the lesions if they contain a large amount of pus or fluid.  Laser  hair removal for cases of long-lasting folliculitis. This helps to prevent regrowth of the hair. HOME CARE INSTRUCTIONS  Apply warm compresses to the affected areas as directed by your caregiver.  If antibiotics are prescribed, take them as directed. Finish them even if you start to feel better.  You may take over-the-counter medicines to relieve itching.  Do not shave irritated skin.  Follow up with your caregiver as directed. SEEK IMMEDIATE MEDICAL CARE IF:   You have increasing redness, swelling, or pain in the affected area.  You have a fever. MAKE SURE YOU:  Understand these instructions.  Will watch your condition.  Will get help right away if you are not doing well or get worse.   This information is not intended to replace advice given to you by your health care provider. Make sure you discuss any questions you have with your health care provider.   Document Released: 12/27/2001 Document Revised: 11/08/2014 Document Reviewed: 01/18/2012 Elsevier Interactive Patient Education [employer]

## 2015-11-24 NOTE — ED Notes (Addendum)
Pt states pain to right axilla. States abscesses first noticed 3 days ago. Not draining. Denies hx of same.

## 2015-11-24 NOTE — ED Notes (Signed)
Pt d/c papers given and reviewed. Prescriptions given with a discount card for vicodin.

## 2015-11-24 NOTE — ED Provider Notes (Signed)
CSN: [id number]     Arrival date & time 11/24/15  [id number] History   First MD Initiated Contact with Patient 11/24/15 [phone]     Chief Complaint  Patient presents with  . Abscess     (Consider location/radiation/quality/duration/timing/severity/associated sxs/prior Treatment) Patient is a 30 y.o. female presenting with abscess. The history is provided by the patient.  Abscess Location:  Shoulder/arm Shoulder/arm abscess location:  R axilla Abscess quality: not draining   Duration:  3 days Progression:  Worsening Chronicity:  New Context: not diabetes and not immunosuppression   Relieved by:  Nothing Ineffective treatments:  None tried Associated symptoms: no fever, no nausea and no vomiting   Risk factors: no hx of MRSA and no prior abscess     History reviewed. No pertinent past medical history. History reviewed. No pertinent past surgical history. No family history on file. Social History  Substance Use Topics  . Smoking status: Current Every Day Smoker -- 0.50 packs/day    Types: Cigarettes  . Smokeless tobacco: None  . Alcohol Use: No   OB History    Gravida Para Term Preterm AB TAB SAB Ectopic Multiple Living            2     Review of Systems  Constitutional: Negative for fever.  Gastrointestinal: Negative for nausea and vomiting.  Skin: Positive for wound.  All other systems reviewed and are negative.     Allergies  Review of patient's allergies indicates no known allergies.  Home Medications   Prior to Admission medications   Medication Sig Start Date End Date Taking? Authorizing Provider  etonogestrel (IMPLANON) 68 MG IMPL implant Inject 1 each into the skin once.    Historical Provider, MD  HYDROcodone-acetaminophen (NORCO/VICODIN) 5-325 MG tablet Take one tab po q 4-6 hrs prn pain 09/11/15   Tammy Triplett, PA-C  ibuprofen (ADVIL,MOTRIN) 600 MG tablet Take 1 tablet (600 mg total) by mouth every 6 (six) hours as needed. Take with food 09/11/15   Tammy  Triplett, PA-C   BP 101/62 mmHg  Pulse 95  Temp(Src) 98.7 F (37.1 C) (Oral)  Resp 16  Ht  (1.626 m)  Wt 49.896 kg  BMI 18.87 kg/m2  SpO2 100%  LMP 11/23/2015 Physical Exam  Constitutional: She is oriented to person, place, and time. She appears well-developed and well-nourished.  Non-toxic appearance.  Uncomfortable appearing.  HENT:  Head: Normocephalic.  Right Ear: Tympanic membrane and external ear normal.  Left Ear: Tympanic membrane and external ear normal.  Eyes: EOM and lids are normal. Pupils are equal, round, and reactive to light.  Neck: Normal range of motion. Neck supple. Carotid bruit is not present.  Cardiovascular: Normal rate, regular rhythm, normal heart sounds, intact distal pulses and normal pulses.   Pulmonary/Chest: Breath sounds normal. No respiratory distress.  Abdominal: Soft. Bowel sounds are normal. There is no tenderness. There is no guarding.  Musculoskeletal: Normal range of motion.  Lymphadenopathy:       Head (right side): No submandibular adenopathy present.       Head (left side): No submandibular adenopathy present.    She has no cervical adenopathy.  Neurological: She is alert and oriented to person, place, and time. She has normal strength. No cranial nerve deficit or sensory deficit.  Skin: Skin is warm and dry.  Multiple pustules at the right axilla. On small raised red area c/w small abscess. No red streaks.  Psychiatric: She has a normal mood and affect. Her speech is  normal.  Nursing note and vitals reviewed.   ED Course  Procedures (including critical care time) Labs Review Labs Reviewed - No data to display  Imaging Review No results found. I have personally reviewed and evaluated these images and lab results as part of my medical decision-making.   EKG Interpretation None      MDM The vital signs are well within normal limits. The examination favors infected folliculitis involving the right axilla. Patient  acknowledges that she shaves in that area. There's been no recent change in soaps or deodorants or body splashes.  The patient is asked to use warm Epsom salt tub soaks 2 times daily over the next 5-7 days. The patient is given a prescription for Septra twice a day, and prescription for Norco for pain. The patient will follow with Dr. mucus in 1-2 weeks for recheck and evaluation. The patient is to return to the emergency department sooner if any signs of advancing infection.    Final diagnoses:  None    *I have reviewed nursing notes, vital signs, and all appropriate lab and imaging results for this patient.[address] PA-C 11/24/15 0454  Bethann Berkshire, MD 11/24/15 1536

## 2015-11-24 NOTE — ED Notes (Signed)
Pt. Given discount card for vicodin. Antibiotc is $4 dollars at [facility]. Patient ok with this.

## 2015-11-30 ENCOUNTER — Emergency Department (HOSPITAL_COMMUNITY)
Admission: EM | Admit: 2015-11-30 | Discharge: 2015-12-01 | Disposition: A | Discharge status: Home / Self Care | Payer: Self-pay | Attending: Emergency Medicine | Admitting: Emergency Medicine

## 2015-11-30 ENCOUNTER — Emergency Department (HOSPITAL_COMMUNITY): Payer: Self-pay

## 2015-11-30 ENCOUNTER — Encounter (HOSPITAL_COMMUNITY): Payer: Self-pay | Admitting: Emergency Medicine

## 2015-11-30 DIAGNOSIS — N200 Calculus of kidney: Secondary | ICD-10-CM | POA: Insufficient documentation

## 2015-11-30 DIAGNOSIS — F17210 Nicotine dependence, cigarettes, uncomplicated: Secondary | ICD-10-CM | POA: Insufficient documentation

## 2015-11-30 DIAGNOSIS — R109 Unspecified abdominal pain: Secondary | ICD-10-CM

## 2015-11-30 DIAGNOSIS — Z3202 Encounter for pregnancy test, result negative: Secondary | ICD-10-CM | POA: Insufficient documentation

## 2015-11-30 DIAGNOSIS — R197 Diarrhea, unspecified: Secondary | ICD-10-CM | POA: Insufficient documentation

## 2015-11-30 LAB — COMPREHENSIVE METABOLIC PANEL
ALK PHOS: 46 U/L (ref 38–126)
ALT: 31 U/L (ref 14–54)
AST: 44 U/L — AB (ref 15–41)
Albumin: 3.8 g/dL (ref 3.5–5.0)
Anion gap: 7 (ref 5–15)
BUN: 15 mg/dL (ref 6–20)
CHLORIDE: 106 mmol/L (ref 101–111)
CO2: 27 mmol/L (ref 22–32)
CREATININE: 1.02 mg/dL — AB (ref 0.44–1.00)
Calcium: 8.8 mg/dL — ABNORMAL LOW (ref 8.9–10.3)
GFR calc Af Amer: >60 mL/min (ref >60)
Glucose, Bld: 103 mg/dL — ABNORMAL HIGH (ref 65–99)
Potassium: 3.5 mmol/L (ref 3.5–5.1)
Sodium: 140 mmol/L (ref 135–145)
Total Bilirubin: 0.5 mg/dL (ref 0.3–1.2)
Total Protein: 6.8 g/dL (ref 6.5–8.1)

## 2015-11-30 LAB — URINALYSIS, ROUTINE W REFLEX MICROSCOPIC
BILIRUBIN URINE: NEGATIVE
Glucose, UA: NEGATIVE mg/dL
KETONES UR: 15 mg/dL — AB
Leukocytes, UA: NEGATIVE
NITRITE: NEGATIVE
PH: 6 (ref 5.0–8.0)
SPECIFIC GRAVITY, URINE: 1.025 (ref 1.005–1.030)

## 2015-11-30 LAB — CBC WITH DIFFERENTIAL/PLATELET
BASOS PCT: 0 %
Basophils Absolute: 0 10*3/uL (ref 0.0–0.1)
EOS ABS: 0 10*3/uL (ref 0.0–0.7)
EOS PCT: 0 %
HCT: 37.4 % (ref 36.0–46.0)
Hemoglobin: 12.7 g/dL (ref 12.0–15.0)
LYMPHS ABS: 1.6 10*3/uL (ref 0.7–4.0)
Lymphocytes Relative: 13 %
MCH: 31.4 pg (ref 26.0–34.0)
MCHC: 34 g/dL (ref 30.0–36.0)
MCV: 92.6 fL (ref 78.0–100.0)
MONOS PCT: 7 %
Monocytes Absolute: 0.9 10*3/uL (ref 0.1–1.0)
NEUTROS PCT: 80 %
Neutro Abs: 9.9 10*3/uL — ABNORMAL HIGH (ref 1.7–7.7)
PLATELETS: 312 10*3/uL (ref 150–400)
RBC: 4.04 MIL/uL (ref 3.87–5.11)
RDW: 12.3 % (ref 11.5–15.5)
WBC: 12.4 10*3/uL — AB (ref 4.0–10.5)

## 2015-11-30 LAB — LIPASE, BLOOD: LIPASE: 18 U/L (ref 11–51)

## 2015-11-30 LAB — URINE MICROSCOPIC-ADD ON

## 2015-11-30 LAB — PREGNANCY, URINE: PREG TEST UR: NEGATIVE

## 2015-11-30 MED ORDER — HYDROMORPHONE HCL 1 MG/ML IJ SOLN
1.0000 mg | Freq: Once | INTRAMUSCULAR | Status: AC
  Administered 2015-11-30: 1 mg via INTRAVENOUS
  Filled 2015-11-30: qty 1

## 2015-11-30 MED ORDER — HYDROCODONE-ACETAMINOPHEN 5-325 MG PO TABS: 1.0000 | ORAL_TABLET | Freq: Four times a day (QID) | ORAL | Status: DC | PRN

## 2015-11-30 MED ORDER — SODIUM CHLORIDE 0.9 % IV BOLUS (SEPSIS)
1000.0000 mL | Freq: Once | INTRAVENOUS | Status: AC
  Administered 2015-11-30: 1000 mL via INTRAVENOUS

## 2015-11-30 MED ORDER — HYDROMORPHONE HCL 1 MG/ML IJ SOLN
1.0000 mg | Freq: Once | INTRAMUSCULAR | Status: AC
Start: 2015-12-01 — End: 2015-11-30
  Administered 2015-11-30: 1 mg via INTRAVENOUS
  Filled 2015-11-30: qty 1

## 2015-11-30 MED ORDER — IOHEXOL 300 MG/ML  SOLN
100.0000 mL | Freq: Once | INTRAMUSCULAR | Status: AC | PRN
  Administered 2015-11-30: 100 mL via INTRAVENOUS

## 2015-11-30 MED ORDER — ONDANSETRON HCL 4 MG/2ML IJ SOLN
4.0000 mg | Freq: Once | INTRAMUSCULAR | Status: AC
  Administered 2015-11-30: 4 mg via INTRAVENOUS
  Filled 2015-11-30: qty 2

## 2015-11-30 MED ORDER — IBUPROFEN 600 MG PO TABS: 600.0000 mg | ORAL_TABLET | Freq: Four times a day (QID) | ORAL | Status: DC | PRN

## 2015-11-30 MED ORDER — PROMETHAZINE HCL 25 MG PO TABS: 25.0000 mg | ORAL_TABLET | Freq: Four times a day (QID) | ORAL | Status: DC | PRN

## 2015-11-30 MED ORDER — KETOROLAC TROMETHAMINE 30 MG/ML IJ SOLN
15.0000 mg | Freq: Once | INTRAMUSCULAR | Status: AC
  Administered 2015-11-30: 15 mg via INTRAVENOUS
  Filled 2015-11-30: qty 1

## 2015-11-30 NOTE — ED Provider Notes (Signed)
Assumed care at change of shift for CT of the abdomen and pelvis pending. This significant for a 5 mm right-sided ureteral stone. No hydronephrosis. Renal function is okay. Patient does have a mild leukocytosis but this is nonspecific and could be stress demargination. Urinalysis is not particularly suggestive of infection. She is afebrile. Continues to have some pain. Will re-dose dilaudid and additionally give some Toradol. Reassess and assuming that symptoms are adequately controlled anticipate discharge with urology follow-up as needed. Return precautions sooner were discussed.  Raeford Razor, MD 11/30/15 [phone]

## 2015-11-30 NOTE — ED Notes (Signed)
Drinking po contrast at this time. Tolerating well. 

## 2015-11-30 NOTE — Discharge Instructions (Signed)
Follow up this week for recheck.  Drink plenty of fluids Kidney Stones Kidney stones (urolithiasis) are deposits that form inside your kidneys. The intense pain is caused by the stone moving through the urinary tract. When the stone moves, the ureter goes into spasm around the stone. The stone is usually passed in the urine.  CAUSES   A disorder that makes certain neck glands produce too much parathyroid hormone (primary hyperparathyroidism).  A buildup of uric acid crystals, similar to gout in your joints.  Narrowing (stricture) of the ureter.  A kidney obstruction present at birth (congenital obstruction).  Previous surgery on the kidney or ureters.  Numerous kidney infections. SYMPTOMS   Feeling sick to your stomach (nauseous).  Throwing up (vomiting).  Blood in the urine (hematuria).  Pain that usually spreads (radiates) to the groin.  Frequency or urgency of urination. DIAGNOSIS   Taking a history and physical exam.  Blood or urine tests.  CT scan.  Occasionally, an examination of the inside of the urinary bladder (cystoscopy) is performed. TREATMENT   Observation.  Increasing your fluid intake.  Extracorporeal shock wave lithotripsy--This is a noninvasive procedure that uses shock waves to break up kidney stones.  Surgery may be needed if you have severe pain or persistent obstruction. There are various surgical procedures. Most of the procedures are performed with the use of small instruments. Only small incisions are needed to accommodate these instruments, so recovery time is minimized. The size, location, and chemical composition are all important variables that will determine the proper choice of action for you. Talk to your health care provider to better understand your situation so that you will minimize the risk of injury to yourself and your kidney.  HOME CARE INSTRUCTIONS   Drink enough water and fluids to keep your urine clear or pale yellow. This will  help you to pass the stone or stone fragments.  Strain all urine through the provided strainer. Keep all particulate matter and stones for your health care provider to see. The stone causing the pain may be as small as a grain of salt. It is very important to use the strainer each and every time you pass your urine. The collection of your stone will allow your health care provider to analyze it and verify that a stone has actually passed. The stone analysis will often identify what you can do to reduce the incidence of recurrences.  Only take over-the-counter or prescription medicines for pain, discomfort, or fever as directed by your health care provider.  Keep all follow-up visits as told by your health care provider. This is important.  Get follow-up X-rays if required. The absence of pain does not always mean that the stone has passed. It may have only stopped moving. If the urine remains completely obstructed, it can cause loss of kidney function or even complete destruction of the kidney. It is your responsibility to make sure X-rays and follow-ups are completed. Ultrasounds of the kidney can show blockages and the status of the kidney. Ultrasounds are not associated with any radiation and can be performed easily in a matter of minutes.  Make changes to your daily diet as told by your health care provider. You may be told to:  Limit the amount of salt that you eat.  Eat 5 or more servings of fruits and vegetables each day.  Limit the amount of meat, poultry, fish, and eggs that you eat.  Collect a 24-hour urine sample as told by your health  care provider.You may need to collect another urine sample every 6-12 months. SEEK MEDICAL CARE IF:  You experience pain that is progressive and unresponsive to any pain medicine you have been prescribed. SEEK IMMEDIATE MEDICAL CARE IF:   Pain cannot be controlled with the prescribed medicine.  You have a fever or shaking chills.  The severity  or intensity of pain increases over 18 hours and is not relieved by pain medicine.  You develop a new onset of abdominal pain.  You feel faint or pass out.  You are unable to urinate.   This information is not intended to replace advice given to you by your health care provider. Make sure you discuss any questions you have with your health care provider.   Document Released: 10/18/2005 Document Revised: 07/09/2015 Document Reviewed: 03/21/2013 Elsevier Interactive Patient Education [employer]

## 2015-11-30 NOTE — ED Notes (Signed)
PT states her stomach started hurting about an hour ago.  C/O vomiting and diarrhea

## 2015-11-30 NOTE — ED Provider Notes (Signed)
CSN: [id number]     Arrival date & time 11/30/15  1939 History   First MD Initiated Contact with Patient 11/30/15 1951     Chief Complaint  Patient presents with  . Abdominal Pain     (Consider location/radiation/quality/duration/timing/severity/associated sxs/prior Treatment) Patient is a 30 y.o. female presenting with abdominal pain. The history is provided by the patient (Patient complains of lower abdominal pain some vomiting and some loose stools).  Abdominal Pain Pain location:  LLQ and RLQ Pain quality: aching   Pain radiates to:  Does not radiate Pain severity:  Moderate Onset quality:  Sudden Timing:  Constant Progression:  Waxing and waning Chronicity:  New Associated symptoms: diarrhea and vomiting   Associated symptoms: no chest pain, no cough, no fatigue and no hematuria     History reviewed. No pertinent past medical history. History reviewed. No pertinent past surgical history. History reviewed. No pertinent family history. Social History  Substance Use Topics  . Smoking status: Current Every Day Smoker -- 0.50 packs/day    Types: Cigarettes  . Smokeless tobacco: None  . Alcohol Use: No   OB History    Gravida Para Term Preterm AB TAB SAB Ectopic Multiple Living            2     Review of Systems  Constitutional: Negative for appetite change and fatigue.  HENT: Negative for congestion, ear discharge and sinus pressure.   Eyes: Negative for discharge.  Respiratory: Negative for cough.   Cardiovascular: Negative for chest pain.  Gastrointestinal: Positive for vomiting, abdominal pain and diarrhea.  Genitourinary: Negative for frequency and hematuria.  Musculoskeletal: Negative for back pain.  Skin: Negative for rash.  Neurological: Negative for seizures and headaches.  Psychiatric/Behavioral: Negative for hallucinations.      Allergies  Review of patient's allergies indicates no known allergies.  Home Medications   Prior to Admission  medications   Medication Sig Start Date End Date Taking? Authorizing Provider  etonogestrel (IMPLANON) 68 MG IMPL implant Inject 1 each into the skin once.    Historical Provider, MD  HYDROcodone-acetaminophen (NORCO/VICODIN) 5-325 MG tablet Take 1 tablet by mouth every 6 (six) hours as needed. 11/30/15   Bethann Berkshire, MD  ibuprofen (ADVIL,MOTRIN) 600 MG tablet Take 1 tablet (600 mg total) by mouth every 6 (six) hours as needed. 11/24/15   Ivery Quale, PA-C  promethazine (PHENERGAN) 25 MG tablet Take 1 tablet (25 mg total) by mouth every 6 (six) hours as needed for nausea or vomiting. 11/30/15   Bethann Berkshire, MD  sulfamethoxazole-trimethoprim (BACTRIM DS,SEPTRA DS) 800-160 MG tablet Take 1 tablet by mouth 2 (two) times daily. 11/24/15 12/01/15  Ivery Quale, PA-C   BP 115/71 mmHg  Pulse 86  Temp(Src) 98.6 F (37 C) (Temporal)  Resp 18  Ht  (1.626 m)  Wt 110 lb (49.896 kg)  BMI 18.87 kg/m2  SpO2 100%  LMP 11/23/2015 Physical Exam  Constitutional: She is oriented to person, place, and time. She appears well-developed.  HENT:  Head: Normocephalic.  Eyes: Conjunctivae and EOM are normal. No scleral icterus.  Neck: Neck supple. No thyromegaly present.  Cardiovascular: Normal rate and regular rhythm.  Exam reveals no gallop and no friction rub.   No murmur heard. Pulmonary/Chest: No stridor. She has no wheezes. She has no rales. She exhibits no tenderness.  Abdominal: She exhibits no distension. There is tenderness. There is no rebound.  Moderate tenderness left lower quadrant right lower quadrant  Musculoskeletal: Normal range of motion.  She exhibits no edema.  Lymphadenopathy:    She has no cervical adenopathy.  Neurological: She is oriented to person, place, and time. She exhibits normal muscle tone. Coordination normal.  Skin: No rash noted. No erythema.  Psychiatric: She has a normal mood and affect. Her behavior is normal.    ED Course  Procedures (including critical care  time) Labs Review Labs Reviewed  COMPREHENSIVE METABOLIC PANEL - Abnormal; Notable for the following:    Glucose, Bld 103 (*)    Creatinine, Ser 1.02 (*)    Calcium 8.8 (*)    AST 44 (*)    All other components within normal limits  URINALYSIS, ROUTINE W REFLEX MICROSCOPIC (NOT AT [facility]) - Abnormal; Notable for the following:    Hgb urine dipstick LARGE (*)    Ketones, ur 15 (*)    Protein, ur TRACE (*)    All other components within normal limits  CBC WITH DIFFERENTIAL/PLATELET - Abnormal; Notable for the following:    WBC 12.4 (*)    Neutro Abs 9.9 (*)    All other components within normal limits  URINE MICROSCOPIC-ADD ON - Abnormal; Notable for the following:    Squamous Epithelial / LPF 6-30 (*)    Bacteria, UA FEW (*)    All other components within normal limits  URINE CULTURE  LIPASE, BLOOD  PREGNANCY, URINE    Imaging Review No results found. I have personally reviewed and evaluated these images and lab results as part of my medical decision-making.   EKG Interpretation None      MDM   Final diagnoses:  Abdominal pain in female    Labs unremarkable. CT scan pending patient has no PCP and has been referred to [facility] in [place], MD 11/30/15 2209

## 2015-12-01 MED ORDER — SODIUM CHLORIDE 0.9 % IV BOLUS (SEPSIS)
1000.0000 mL | Freq: Once | INTRAVENOUS | Status: AC
  Administered 2015-12-01: 1000 mL via INTRAVENOUS

## 2015-12-01 MED ORDER — HYDROMORPHONE HCL 1 MG/ML IJ SOLN
1.0000 mg | Freq: Once | INTRAMUSCULAR | Status: AC
  Administered 2015-12-01: 1 mg via INTRAVENOUS
  Filled 2015-12-01: qty 1

## 2015-12-01 NOTE — ED Provider Notes (Signed)
Pt states she is still having pain. BP is in the 90's. Will give fluid bolus and give more pain meds. Pt appears to have been sleeping.   03:30 AM pt is asleep, wakens up and states still painful.  Feel she is ready for discharge at this time. BP has improved with IV fluids.   Diagnoses that have been ruled out:  None  Diagnoses that are still under consideration:  None  Final diagnoses:  Abdominal pain in female  Kidney stone   Plan discharge  Devoria Albe, MD, Concha Pyo, MD 12/01/15 [phone]

## 2015-12-03 LAB — URINE CULTURE

## 2018-08-16 ENCOUNTER — Encounter: Payer: Self-pay | Admitting: Advanced Practice Midwife

## 2018-08-31 ENCOUNTER — Encounter: Payer: Self-pay | Admitting: Advanced Practice Midwife

## 2018-08-31 ENCOUNTER — Encounter (INDEPENDENT_AMBULATORY_CARE_PROVIDER_SITE_OTHER): Payer: Self-pay

## 2018-08-31 ENCOUNTER — Ambulatory Visit: Payer: BLUE CROSS/BLUE SHIELD | Admitting: Advanced Practice Midwife

## 2018-08-31 VITALS — BP 110/76 | HR 73 | Ht 64.0 in | Wt 117.5 lb

## 2018-08-31 DIAGNOSIS — Z3009 Encounter for other general counseling and advice on contraception: Secondary | ICD-10-CM

## 2018-08-31 DIAGNOSIS — Z3202 Encounter for pregnancy test, result negative: Secondary | ICD-10-CM

## 2018-08-31 LAB — POCT URINE PREGNANCY: Preg Test, Ur: NEGATIVE

## 2018-08-31 NOTE — Progress Notes (Signed)
[facility] Visit  Patient name: [name] MRN [id number]  Date of birth: 01-13-1986  CC & HPI:  [name] is a 32 y.o. African American female presenting today for birth control. She still has the nexplanon in that she got inserted 3/13, has been having regular periods.  Having unprotected intercourse, last sex last night. Period is about 2 weeks late, UPT neg today. Wants to get nexplanon again.   Pertinent History Reviewed:  Medical & Surgical Hx:   History reviewed. No pertinent past medical history. Past Surgical History:  Procedure Laterality Date  . kidney stones     Family History  Problem Relation Age of Onset  . Diabetes Paternal Grandmother   . Cancer Mother   . Sickle cell trait Brother   . Sickle cell trait Daughter     Current Outpatient Medications:  .  etonogestrel (IMPLANON) 68 MG IMPL implant, Inject 1 each into the skin once., Disp: , Rfl:  .  ibuprofen (ADVIL,MOTRIN) 600 MG tablet, Take 1 tablet (600 mg total) by mouth every 6 (six) hours as needed., Disp: 30 tablet, Rfl: 0 Social History: Reviewed -  reports that she has been smoking cigarettes. She has a 2.50 pack-year smoking history. She has never used smokeless tobacco.  Review of Systems:   Constitutional: Negative for fever and chills Eyes: Negative for visual disturbances Respiratory: Negative for shortness of breath, dyspnea Cardiovascular: Negative for chest pain or palpitations  Gastrointestinal: Negative for vomiting, diarrhea and constipation; no abdominal pain Genitourinary: Negative for dysuria and urgency, vaginal irritation or itching Musculoskeletal: Negative for back pain, joint pain, myalgias  Neurological: Negative for dizziness and headaches    Objective Findings:    Physical Examination: Vitals:   08/31/18 0838  BP: 110/76  Pulse: 73   General appearance - well appearing, and in no distress Mental status - alert, oriented to person, place, and  time Chest:  Normal respiratory effort Heart - normal rate and regular rhythm Pelvic: deferred Musculoskeletal:  Normal range of motion without pain Extremities:  No edema    Results for orders placed or performed in visit on 08/31/18 (from the past 24 hour(s))  POCT urine pregnancy   Collection Time: 08/31/18  9:01 AM  Result Value Ref Range   Preg Test, Ur Negative Negative      Assessment & Plan:  A:   Contraception mgt P:  No sex at all until she gets nexplanon   Return for 2 weeks for nexplanon removal and reinsertion. . If period starts before then, call us and we will get her in sooner  Jacklyn Shell CNM 08/31/2018 9:16 AM

## 2018-09-14 ENCOUNTER — Encounter: Payer: BLUE CROSS/BLUE SHIELD | Admitting: Advanced Practice Midwife

## 2019-01-11 ENCOUNTER — Emergency Department (HOSPITAL_COMMUNITY)
Admission: EM | Admit: 2019-01-11 | Discharge: 2019-01-11 | Disposition: A | Discharge status: Home / Self Care | Payer: Medicaid Other | Attending: Emergency Medicine | Admitting: Emergency Medicine

## 2019-01-11 ENCOUNTER — Encounter (HOSPITAL_COMMUNITY): Payer: Self-pay

## 2019-01-11 ENCOUNTER — Other Ambulatory Visit: Payer: Self-pay

## 2019-01-11 DIAGNOSIS — F17210 Nicotine dependence, cigarettes, uncomplicated: Secondary | ICD-10-CM | POA: Insufficient documentation

## 2019-01-11 DIAGNOSIS — K0889 Other specified disorders of teeth and supporting structures: Secondary | ICD-10-CM | POA: Diagnosis present

## 2019-01-11 DIAGNOSIS — K0500 Acute gingivitis, plaque induced: Secondary | ICD-10-CM | POA: Insufficient documentation

## 2019-01-11 DIAGNOSIS — Z79899 Other long term (current) drug therapy: Secondary | ICD-10-CM | POA: Diagnosis not present

## 2019-01-11 DIAGNOSIS — K0510 Chronic gingivitis, plaque induced: Secondary | ICD-10-CM

## 2019-01-11 MED ORDER — OXYCODONE-ACETAMINOPHEN 5-325 MG PO TABS
1.0000 | ORAL_TABLET | Freq: Once | ORAL | Status: AC
  Administered 2019-01-11: 1 via ORAL
  Filled 2019-01-11: qty 1

## 2019-01-11 MED ORDER — NAPROXEN 375 MG PO TABS: 375.0000 mg | ORAL_TABLET | Freq: Two times a day (BID) | ORAL | 0 refills | Status: DC

## 2019-01-11 MED ORDER — CHLORHEXIDINE GLUCONATE 0.12 % MT SOLN: 15.0000 mL | Freq: Two times a day (BID) | OROMUCOSAL | 0 refills | Status: DC

## 2019-01-11 MED ORDER — AMOXICILLIN 500 MG PO CAPS: 500.0000 mg | ORAL_CAPSULE | Freq: Three times a day (TID) | ORAL | 0 refills | Status: DC

## 2019-01-11 NOTE — Discharge Instructions (Signed)
Follow up with your dentist as scheduled.

## 2019-01-11 NOTE — ED Notes (Signed)
Patient verbalizes understanding of discharge instructions, prescriptions, and follow up care. Patient ambulatory out of department at this time. 

## 2019-01-11 NOTE — ED Provider Notes (Signed)
[facility] EMERGENCY DEPARTMENT Provider Note   CSN: [id number] Arrival date & time: 01/11/19  1905    History   Chief Complaint Chief Complaint  Patient presents with  . Dental Pain    HPI [name] is a 33 y.o. female.     The history is provided by the patient. No language interpreter was used.  Dental Pain  Location:  Lower Lower teeth location:  20/LL 2nd bicuspid Quality:  Aching Severity:  Severe Duration:  1 day Timing:  Constant Progression:  Worsening Chronicity:  New Context: poor dentition   Associated symptoms: no facial swelling and no trismus     History reviewed. No pertinent past medical history.  There are no active problems to display for this patient.   Past Surgical History:  Procedure Laterality Date  . kidney stones       OB History    Gravida  2   Para  2   Term      Preterm  2   AB      Living  2     SAB      TAB      Ectopic      Multiple      Live Births  2            Home Medications    Prior to Admission medications   Medication Sig Start Date End Date Taking? Authorizing Provider  etonogestrel (IMPLANON) 68 MG IMPL implant Inject 1 each into the skin once.    [provider]  ibuprofen (ADVIL,MOTRIN) 600 MG tablet Take 1 tablet (600 mg total) by mouth every 6 (six) hours as needed. 11/24/15   Ivery Quale, PA-C  omeprazole (PRILOSEC) 20 MG capsule Take 1 capsule (20 mg total) by mouth daily. Patient not taking: Reported on 09/11/2015 07/22/15 09/11/15  Janne Napoleon, NP    Family History Family History  Problem Relation Age of Onset  . Diabetes Paternal Grandmother   . Cancer Mother   . Sickle cell trait Brother   . Sickle cell trait Daughter     Social History Social History   Tobacco Use  . Smoking status: Current Every Day Smoker    Packs/day: 0.50    Years: 5.00    Pack years: 2.50    Types: Cigarettes  . Smokeless tobacco: Never Used  Substance Use Topics  .  Alcohol use: No  . Drug use: No     Allergies   Patient has no known allergies.   Review of Systems Review of Systems  HENT: Positive for dental problem. Negative for facial swelling.   All other systems reviewed and are negative.    Physical Exam Updated Vital Signs BP 121/67 (BP Location: Right Arm)   Pulse 100   Temp 98.5 F (36.9 C) (Oral)   Resp 17   Ht 5\' 4"  (1.626 m)   Wt 56.7 kg   SpO2 100%   BMI 21.46 kg/m   Physical Exam Vitals signs and nursing note reviewed.  HENT:     Mouth/Throat:     Mouth: Mucous membranes are moist.     Dentition: Dental tenderness and gingival swelling present.  Eyes:     Conjunctiva/sclera: Conjunctivae normal.  Cardiovascular:     Rate and Rhythm: Normal rate and regular rhythm.  Pulmonary:     Effort: Pulmonary effort is normal.     Breath sounds: Normal breath sounds.  Lymphadenopathy:     Cervical: No cervical  adenopathy.  Skin:    General: Skin is warm and dry.  Neurological:     Mental Status: She is alert and oriented to person, place, and time.  Psychiatric:        Mood and Affect: Mood normal.      ED Treatments / Results  Labs (all labs ordered are listed, but only abnormal results are displayed) Labs Reviewed - No data to display  EKG None  Radiology No results found.  Procedures Procedures (including critical care time)  Medications Ordered in ED Medications  oxyCODONE-acetaminophen (PERCOCET/ROXICET) 5-325 MG per tablet 1 tablet (1 tablet Oral Given 01/11/19 2035)     Initial Impression / Assessment and Plan / ED Course  I have reviewed the triage vital signs and the nursing notes.  Pertinent labs & imaging results that were available during my care of the patient were reviewed by me and considered in my medical decision making (see chart for details).        Patient with dentalgia and gingivitis.  No abscess requiring immediate incision and drainage.  Exam not concerning for Ludwig's  angina or pharyngeal abscess.  Will treat with naproxen, amoxicillin, and peridex. Pt instructed to follow-up with dentist (has an appointment on Monday).  Discussed return precautions. Pt safe for discharge.  Final Clinical Impressions(s) / ED Diagnoses   Final diagnoses:  Tooth pain  Gingivitis    ED Discharge Orders         Ordered    naproxen (NAPROSYN) 375 MG tablet  2 times daily     01/11/19 2010    amoxicillin (AMOXIL) 500 MG capsule  3 times daily     01/11/19 2010    chlorhexidine (PERIDEX) 0.12 % solution  2 times daily     01/11/19 2010           Felicie Morn, NP 01/11/19 2037    Samuel Jester, DO 01/12/19 2316

## 2019-01-11 NOTE — ED Triage Notes (Signed)
Pt c/o left lower tooth pain started last night. Pt scheduled with dentist on Monday. Pt denies fever

## 2020-01-15 ENCOUNTER — Telehealth: Payer: Self-pay | Admitting: Women's Health

## 2020-01-15 NOTE — Telephone Encounter (Signed)
Tried to reach the patient to remind her of her appointment/restrictions, call can not be completed at this time. °

## 2020-01-16 ENCOUNTER — Encounter: Payer: Medicaid Other | Admitting: Women's Health

## 2021-07-07 ENCOUNTER — Encounter (HOSPITAL_COMMUNITY): Payer: Self-pay

## 2021-07-07 ENCOUNTER — Other Ambulatory Visit: Payer: Self-pay

## 2021-07-07 ENCOUNTER — Emergency Department (HOSPITAL_COMMUNITY)
Admission: EM | Admit: 2021-07-07 | Discharge: 2021-07-07 | Disposition: A | Discharge status: Home / Self Care | Payer: Medicaid Other | Attending: Emergency Medicine | Admitting: Emergency Medicine

## 2021-07-07 DIAGNOSIS — F17210 Nicotine dependence, cigarettes, uncomplicated: Secondary | ICD-10-CM | POA: Insufficient documentation

## 2021-07-07 DIAGNOSIS — U071 COVID-19: Secondary | ICD-10-CM | POA: Insufficient documentation

## 2021-07-07 DIAGNOSIS — R1013 Epigastric pain: Secondary | ICD-10-CM | POA: Insufficient documentation

## 2021-07-07 DIAGNOSIS — R112 Nausea with vomiting, unspecified: Secondary | ICD-10-CM | POA: Diagnosis present

## 2021-07-07 LAB — CBC WITH DIFFERENTIAL/PLATELET
Abs Immature Granulocytes: 0.01 10*3/uL (ref 0.00–0.07)
Basophils Absolute: 0 10*3/uL (ref 0.0–0.1)
Basophils Relative: 0 %
Eosinophils Absolute: 0 10*3/uL (ref 0.0–0.5)
Eosinophils Relative: 1 %
HCT: 39.2 % (ref 36.0–46.0)
Hemoglobin: 13 g/dL (ref 12.0–15.0)
Immature Granulocytes: 0 %
Lymphocytes Relative: 34 %
Lymphs Abs: 1.2 10*3/uL (ref 0.7–4.0)
MCH: 31.2 pg (ref 26.0–34.0)
MCHC: 33.2 g/dL (ref 30.0–36.0)
MCV: 94 fL (ref 80.0–100.0)
Monocytes Absolute: 0.5 10*3/uL (ref 0.1–1.0)
Monocytes Relative: 15 %
Neutro Abs: 1.8 10*3/uL (ref 1.7–7.7)
Neutrophils Relative %: 50 %
Platelets: 226 10*3/uL (ref 150–400)
RBC: 4.17 MIL/uL (ref 3.87–5.11)
RDW: 12.4 % (ref 11.5–15.5)
WBC: 3.6 10*3/uL — ABNORMAL LOW (ref 4.0–10.5)
nRBC: 0 % (ref 0.0–0.2)

## 2021-07-07 LAB — COMPREHENSIVE METABOLIC PANEL
ALT: 23 U/L (ref 0–44)
AST: 18 U/L (ref 15–41)
Albumin: 4 g/dL (ref 3.5–5.0)
Alkaline Phosphatase: 56 U/L (ref 38–126)
Anion gap: 7 (ref 5–15)
BUN: 10 mg/dL (ref 6–20)
CO2: 25 mmol/L (ref 22–32)
Calcium: 8.6 mg/dL — ABNORMAL LOW (ref 8.9–10.3)
Chloride: 108 mmol/L (ref 98–111)
Creatinine, Ser: 0.83 mg/dL (ref 0.44–1.00)
GFR, Estimated: >60 mL/min (ref >60)
Glucose, Bld: 102 mg/dL — ABNORMAL HIGH (ref 70–99)
Potassium: 3.3 mmol/L — ABNORMAL LOW (ref 3.5–5.1)
Sodium: 140 mmol/L (ref 135–145)
Total Bilirubin: 0.1 mg/dL — ABNORMAL LOW (ref 0.3–1.2)
Total Protein: 7.6 g/dL (ref 6.5–8.1)

## 2021-07-07 LAB — PREGNANCY, URINE: Preg Test, Ur: NEGATIVE

## 2021-07-07 LAB — RESP PANEL BY RT-PCR (FLU A&B, COVID) ARPGX2
Influenza A by PCR: NEGATIVE
Influenza B by PCR: NEGATIVE
SARS Coronavirus 2 by RT PCR: POSITIVE — AB

## 2021-07-07 LAB — URINALYSIS, ROUTINE W REFLEX MICROSCOPIC
Bilirubin Urine: NEGATIVE
Glucose, UA: NEGATIVE mg/dL
Hgb urine dipstick: NEGATIVE
Ketones, ur: NEGATIVE mg/dL
Leukocytes,Ua: NEGATIVE
Nitrite: NEGATIVE
Specific Gravity, Urine: >1.03 — ABNORMAL HIGH (ref 1.005–1.030)
pH: 6 (ref 5.0–8.0)

## 2021-07-07 LAB — URINALYSIS, MICROSCOPIC (REFLEX)

## 2021-07-07 LAB — LIPASE, BLOOD: Lipase: 19 U/L (ref 11–51)

## 2021-07-07 MED ORDER — ONDANSETRON HCL 4 MG/2ML IJ SOLN
4.0000 mg | Freq: Once | INTRAMUSCULAR | Status: AC
  Administered 2021-07-07: 4 mg via INTRAVENOUS
  Filled 2021-07-07: qty 2

## 2021-07-07 MED ORDER — SODIUM CHLORIDE 0.9 % IV BOLUS
1000.0000 mL | Freq: Once | INTRAVENOUS | Status: AC
  Administered 2021-07-07: 1000 mL via INTRAVENOUS

## 2021-07-07 MED ORDER — ONDANSETRON 4 MG PO TBDP: 4.0000 mg | ORAL_TABLET | Freq: Three times a day (TID) | ORAL | 0 refills | Status: DC | PRN

## 2021-07-07 NOTE — Discharge Instructions (Addendum)
Try and keep her self hydrated.

## 2021-07-07 NOTE — ED Triage Notes (Signed)
Pt to er room number 4, pt states that she woke up yesterday and had some nausea, states that the nausea continued and has now been vomiting a lot.  States that she has some abd pain.

## 2021-07-07 NOTE — ED Provider Notes (Signed)
[facility] EMERGENCY DEPARTMENT Provider Note   CSN: [id number] Arrival date & time: 07/07/21  0813     History Chief Complaint  Patient presents with   Nausea    [name] is a 35 y.o. female.  HPI Patient presents with nausea and vomiting.  Woke up yesterday with the nausea.  Some upper abdominal pain.  No diarrhea.  Decreased appetite.  No fevers but states she has some chills.  Occasional cough.  Feels a little bad all over.  Has not been around anyone sick.  Denies pregnancy.  Pain is dull.  No blood in the emesis.    History reviewed. No pertinent past medical history.  There are no problems to display for this patient.   Past Surgical History:  Procedure Laterality Date   KIDNEY STONE SURGERY     kidney stones       OB History     Gravida  2   Para  2   Term      Preterm  2   AB      Living  2      SAB      IAB      Ectopic      Multiple      Live Births  2           Family History  Problem Relation Age of Onset   Diabetes Paternal Grandmother    Cancer Mother    Sickle cell trait Brother    Sickle cell trait Daughter     Social History   Tobacco Use   Smoking status: Every Day    Packs/day: 0.50    Years: 5.00    Pack years: 2.50    Types: Cigarettes   Smokeless tobacco: Never  Vaping Use   Vaping Use: Never used  Substance Use Topics   Alcohol use: Not Currently   Drug use: No    Home Medications Prior to Admission medications   Medication Sig Start Date End Date Taking? Authorizing Provider  etonogestrel (NEXPLANON) 68 MG IMPL implant Inject 1 each into the skin once.   Yes [provider]  ondansetron (ZOFRAN-ODT) 4 MG disintegrating tablet Take 1 tablet (4 mg total) by mouth every 8 (eight) hours as needed for nausea or vomiting. 07/07/21  Yes Benjiman Core, MD  amoxicillin (AMOXIL) 500 MG capsule Take 1 capsule (500 mg total) by mouth 3 (three) times daily. Patient not taking: Reported on 07/07/2021  01/11/19   Felicie Morn, NP  chlorhexidine (PERIDEX) 0.12 % solution Use as directed 15 mLs in the mouth or throat 2 (two) times daily. Patient not taking: Reported on 07/07/2021 01/11/19   Felicie Morn, NP  ibuprofen (ADVIL,MOTRIN) 600 MG tablet Take 1 tablet (600 mg total) by mouth every 6 (six) hours as needed. Patient not taking: Reported on 07/07/2021 11/24/15   Ivery Quale, PA-C  naproxen (NAPROSYN) 375 MG tablet Take 1 tablet (375 mg total) by mouth 2 (two) times daily. Patient not taking: Reported on 07/07/2021 01/11/19   Felicie Morn, NP    Allergies    Patient has no known allergies.  Review of Systems   Review of Systems  Constitutional:  Positive for appetite change.  HENT:  Negative for congestion.   Respiratory:  Positive for cough.   Cardiovascular:  Negative for chest pain.  Gastrointestinal:  Positive for abdominal pain, nausea and vomiting. Negative for constipation and diarrhea.  Endocrine: Negative for polyuria.  Genitourinary:  Negative for  flank pain.  Musculoskeletal:  Positive for myalgias.  Skin:  Negative for rash.  Neurological:  Negative for weakness.  Psychiatric/Behavioral:  Negative for confusion.    Physical Exam Updated Vital Signs BP 132/89   Pulse (!) 51   Temp 98.4 F (36.9 C) (Oral)   Resp 16   Ht 5\' 4"  (1.626 m)   Wt 54.4 kg   SpO2 100%   BMI 20.60 kg/m   Physical Exam Vitals and nursing note reviewed.  Constitutional:      Appearance: Normal appearance.  HENT:     Head: Atraumatic.  Eyes:     Pupils: Pupils are equal, round, and reactive to light.  Cardiovascular:     Rate and Rhythm: Regular rhythm.  Pulmonary:     Effort: Pulmonary effort is normal.  Abdominal:     Tenderness: There is abdominal tenderness.     Comments: Mild epigastric tenderness without rebound or guarding.  No hernia palpated.  Musculoskeletal:        General: No tenderness.     Cervical back: Neck supple.  Skin:    General: Skin is warm.     Capillary  Refill: Capillary refill takes less than 2 seconds.  Neurological:     Mental Status: She is alert and oriented to person, place, and time.    ED Results / Procedures / Treatments   Labs (all labs ordered are listed, but only abnormal results are displayed) Labs Reviewed  RESP PANEL BY RT-PCR (FLU A&B, COVID) ARPGX2 - Abnormal; Notable for the following components:      Result Value   SARS Coronavirus 2 by RT PCR POSITIVE (*)    All other components within normal limits  COMPREHENSIVE METABOLIC PANEL - Abnormal; Notable for the following components:   Potassium 3.3 (*)    Glucose, Bld 102 (*)    Calcium 8.6 (*)    Total Bilirubin 0.1 (*)    All other components within normal limits  CBC WITH DIFFERENTIAL/PLATELET - Abnormal; Notable for the following components:   WBC 3.6 (*)    All other components within normal limits  URINALYSIS, ROUTINE W REFLEX MICROSCOPIC - Abnormal; Notable for the following components:   APPearance HAZY (*)    Specific Gravity, Urine >1.030 (*)    Protein, ur TRACE (*)    All other components within normal limits  URINALYSIS, MICROSCOPIC (REFLEX) - Abnormal; Notable for the following components:   Bacteria, UA FEW (*)    All other components within normal limits  LIPASE, BLOOD  PREGNANCY, URINE    EKG None  Radiology No results found.  Procedures Procedures   Medications Ordered in ED Medications  ondansetron (ZOFRAN) injection 4 mg (4 mg Intravenous Given 07/07/21 0937)  sodium chloride 0.9 % bolus 1,000 mL (0 mLs Intravenous Stopped 07/07/21 1212)    ED Course  I have reviewed the triage vital signs and the nursing notes.  Pertinent labs & imaging results that were available during my care of the patient were reviewed by me and considered in my medical decision making (see chart for details).    MDM Rules/Calculators/A&P                           Final Clinical Impression(s) / ED Diagnoses Final diagnoses:  COVID-19  Patient with  nausea and vomiting.  Had for 2 days.  Also cough and myalgias.  Labs reassuring.  Found to COVID-positive however.  Overall low risk.  No antivirals at this time.  Symptomatic treatment.  Discharge home.  Patient is vaccinated.  Rx / DC Orders ED Discharge Orders          Ordered    ondansetron (ZOFRAN-ODT) 4 MG disintegrating tablet  Every 8 hours PRN        07/07/21 1205             Benjiman Core, MD 07/08/21 1312

## 2021-07-07 NOTE — ED Notes (Signed)
Pt tolerated PO challenge well. MD made aware.

## 2021-07-23 ENCOUNTER — Other Ambulatory Visit (HOSPITAL_COMMUNITY): Payer: Self-pay | Admitting: *Deleted

## 2021-07-23 DIAGNOSIS — N63 Unspecified lump in unspecified breast: Secondary | ICD-10-CM

## 2021-07-28 ENCOUNTER — Ambulatory Visit (HOSPITAL_COMMUNITY)
Admission: RE | Admit: 2021-07-28 | Discharge: 2021-07-28 | Disposition: A | Discharge status: Home / Self Care | Payer: Medicaid Other | Source: Ambulatory Visit | Attending: *Deleted | Admitting: *Deleted

## 2021-07-28 ENCOUNTER — Other Ambulatory Visit: Payer: Self-pay

## 2021-07-28 DIAGNOSIS — N63 Unspecified lump in unspecified breast: Secondary | ICD-10-CM | POA: Diagnosis present

## 2021-07-28 DIAGNOSIS — N6002 Solitary cyst of left breast: Secondary | ICD-10-CM | POA: Insufficient documentation

## 2021-07-28 DIAGNOSIS — N6001 Solitary cyst of right breast: Secondary | ICD-10-CM | POA: Insufficient documentation

## 2022-05-05 ENCOUNTER — Other Ambulatory Visit (HOSPITAL_COMMUNITY): Payer: Self-pay | Admitting: *Deleted

## 2022-05-05 DIAGNOSIS — N63 Unspecified lump in unspecified breast: Secondary | ICD-10-CM

## 2022-05-25 ENCOUNTER — Encounter (HOSPITAL_COMMUNITY): Payer: Self-pay

## 2022-05-25 ENCOUNTER — Ambulatory Visit (HOSPITAL_COMMUNITY): Admission: RE | Admit: 2022-05-25 | Payer: Medicaid Other | Source: Ambulatory Visit

## 2022-05-25 ENCOUNTER — Encounter (HOSPITAL_COMMUNITY): Payer: Medicaid Other

## 2022-05-25 ENCOUNTER — Ambulatory Visit (HOSPITAL_COMMUNITY): Payer: Medicaid Other

## 2023-01-11 ENCOUNTER — Other Ambulatory Visit: Payer: Self-pay

## 2023-01-11 ENCOUNTER — Emergency Department (HOSPITAL_COMMUNITY)
Admission: EM | Admit: 2023-01-11 | Discharge: 2023-01-11 | Disposition: A | Discharge status: Home / Self Care | Payer: Medicaid Other | Attending: Emergency Medicine | Admitting: Emergency Medicine

## 2023-01-11 ENCOUNTER — Encounter (HOSPITAL_COMMUNITY): Payer: Self-pay | Admitting: *Deleted

## 2023-01-11 DIAGNOSIS — K047 Periapical abscess without sinus: Secondary | ICD-10-CM | POA: Insufficient documentation

## 2023-01-11 DIAGNOSIS — K0889 Other specified disorders of teeth and supporting structures: Secondary | ICD-10-CM | POA: Diagnosis present

## 2023-01-11 MED ORDER — AMOXICILLIN 500 MG PO CAPS: 500.0000 mg | ORAL_CAPSULE | Freq: Three times a day (TID) | ORAL | 0 refills | Status: DC

## 2023-01-11 MED ORDER — HYDROCODONE-ACETAMINOPHEN 5-325 MG PO TABS: 1.0000 | ORAL_TABLET | Freq: Four times a day (QID) | ORAL | 0 refills | Status: DC | PRN

## 2023-01-11 MED ORDER — AMOXICILLIN 250 MG PO CAPS
500.0000 mg | ORAL_CAPSULE | Freq: Once | ORAL | Status: AC
  Administered 2023-01-11: 500 mg via ORAL
  Filled 2023-01-11: qty 2

## 2023-01-11 MED ORDER — HYDROCODONE-ACETAMINOPHEN 5-325 MG PO TABS
1.0000 | ORAL_TABLET | Freq: Once | ORAL | Status: AC
  Administered 2023-01-11: 1 via ORAL
  Filled 2023-01-11: qty 1

## 2023-01-11 NOTE — ED Triage Notes (Signed)
Pt c/o severe mouth pain to left upper face that started yesterday; pt denies any broken teeth on top

## 2023-01-11 NOTE — Discharge Instructions (Signed)
Take the entire course of the antibiotic prescribed.  Once you are pain medicine is completed your infection should be improved to the point where ibuprofen should handle your pain  Do not drive within 4 hours of taking hydrocodone as this will make you drowsy.  Refer to the dental referral list for assistance obtaining a dentist, you will need dental follow-up once this infection is resolved.

## 2023-01-13 NOTE — ED Provider Notes (Signed)
[facility] Provider Note   CSN: [id number] Arrival date & time: 01/11/23  1112     History  Chief Complaint  Patient presents with   Dental Pain    [name] is a 37 y.o. female.  The history is provided by the patient.  Dental Pain Location:  Upper Upper teeth location:  15/LU 2nd molar Quality:  Sharp and shooting Severity:  Severe Onset quality:  Gradual Duration:  1 day Timing:  Constant Progression:  Worsening Chronicity:  New Context comment:  Unknown source, pt denies prior dental problems. Relieved by:  Nothing Worsened by:  Cold food/drink Ineffective treatments:  Acetaminophen Associated symptoms: facial pain   Associated symptoms: no difficulty swallowing, no facial swelling, no fever, no neck pain, no neck swelling and no trismus   Risk factors: no periodontal disease        Home Medications Prior to Admission medications   Medication Sig Start Date End Date Taking? Authorizing Provider  amoxicillin (AMOXIL) 500 MG capsule Take 1 capsule (500 mg total) by mouth 3 (three) times daily. 01/11/23  Yes Shivan Hodes, Almyra Free, PA-C  HYDROcodone-acetaminophen (NORCO/VICODIN) 5-325 MG tablet Take 1 tablet by mouth every 6 (six) hours as needed for severe pain. 01/11/23  Yes Yuri Fana, Almyra Free, PA-C  chlorhexidine (PERIDEX) 0.12 % solution Use as directed 15 mLs in the mouth or throat 2 (two) times daily. Patient not taking: Reported on 07/07/2021 01/11/19   Etta Quill, NP  etonogestrel (NEXPLANON) 68 MG IMPL implant Inject 1 each into the skin once.    [provider]  ibuprofen (ADVIL,MOTRIN) 600 MG tablet Take 1 tablet (600 mg total) by mouth every 6 (six) hours as needed. Patient not taking: Reported on 07/07/2021 11/24/15   Lily Kocher, PA-C  naproxen (NAPROSYN) 375 MG tablet Take 1 tablet (375 mg total) by mouth 2 (two) times daily. Patient not taking: Reported on 07/07/2021 01/11/19   Etta Quill, NP  ondansetron  (ZOFRAN-ODT) 4 MG disintegrating tablet Take 1 tablet (4 mg total) by mouth every 8 (eight) hours as needed for nausea or vomiting. 07/07/21   Davonna Belling, MD      Allergies    Patient has no known allergies.    Review of Systems   Review of Systems  Constitutional:  Negative for fever.  HENT:  Positive for dental problem. Negative for facial swelling and sore throat.   Respiratory:  Negative for shortness of breath.   Musculoskeletal:  Negative for neck pain and neck stiffness.    Physical Exam Updated Vital Signs BP 116/75 (BP Location: Right Arm)   Pulse 83   Temp 99.2 F (37.3 C) (Oral)   Resp 20   Ht '5\' 4"'$  (1.626 m)   Wt 56.7 kg   SpO2 99%   BMI 21.46 kg/m  Physical Exam Constitutional:      General: She is not in acute distress.    Appearance: She is well-developed.  HENT:     Head: Normocephalic and atraumatic.     Jaw: No trismus.     Right Ear: Tympanic membrane and external ear normal.     Left Ear: Tympanic membrane and external ear normal.     Mouth/Throat:     Mouth: Mucous membranes are moist. No oral lesions.     Dentition: Dental abscesses present.     Pharynx: Oropharynx is clear. No posterior oropharyngeal erythema.     Comments: Small pointing pustule medial gingiva at the left upper  2nd molar tooth.  Draining.  No obvious decay of the surrounding teeth, no significant gingival edema.  No facial edema or erythema,  sublingual space soft.  Eyes:     Conjunctiva/sclera: Conjunctivae normal.  Cardiovascular:     Rate and Rhythm: Normal rate.  Pulmonary:     Effort: Pulmonary effort is normal.  Musculoskeletal:        General: Normal range of motion.     Cervical back: Normal range of motion and neck supple.  Lymphadenopathy:     Cervical: No cervical adenopathy.  Skin:    General: Skin is warm and dry.     Findings: No erythema.  Neurological:     Mental Status: She is alert and oriented to person, place, and time.     ED Results /  Procedures / Treatments   Labs (all labs ordered are listed, but only abnormal results are displayed) Labs Reviewed - No data to display  EKG None  Radiology No results found.  Procedures Procedures    Medications Ordered in ED Medications  amoxicillin (AMOXIL) capsule 500 mg (500 mg Oral Given 01/11/23 1250)  HYDROcodone-acetaminophen (NORCO/VICODIN) 5-325 MG per tablet 1 tablet (1 tablet Oral Given 01/11/23 1250)    ED Course/ Medical Decision Making/ A&P                             Medical Decision Making Pt with isolated draining pustule medial gingiva adjacent to her left upper 2nd molar.  Suspect tracking apical abscess.  Abx, pain medications.  Dental f/u, return precautions outlined. No ludwigs angina, no facial cellulitis.  Risk Prescription drug management.           Final Clinical Impression(s) / ED Diagnoses Final diagnoses:  Dental abscess    Rx / DC Orders ED Discharge Orders          Ordered    amoxicillin (AMOXIL) 500 MG capsule  3 times daily        01/11/23 1242    HYDROcodone-acetaminophen (NORCO/VICODIN) 5-325 MG tablet  Every 6 hours PRN        01/11/23 1242              Evalee Jefferson, PA-C 01/13/23 1333    Lajean Saver, MD 01/14/23 1705

## 2023-04-10 IMAGING — MG DIGITAL DIAGNOSTIC BILAT W/ TOMO W/ CAD
6 of 12 series · 6 of 36 positions shown · non-contrast
Comparison: None.

CLINICAL DATA: 34-year-old female with bilateral palpable areas of
concern in each breast.



[L CC synth-2D]
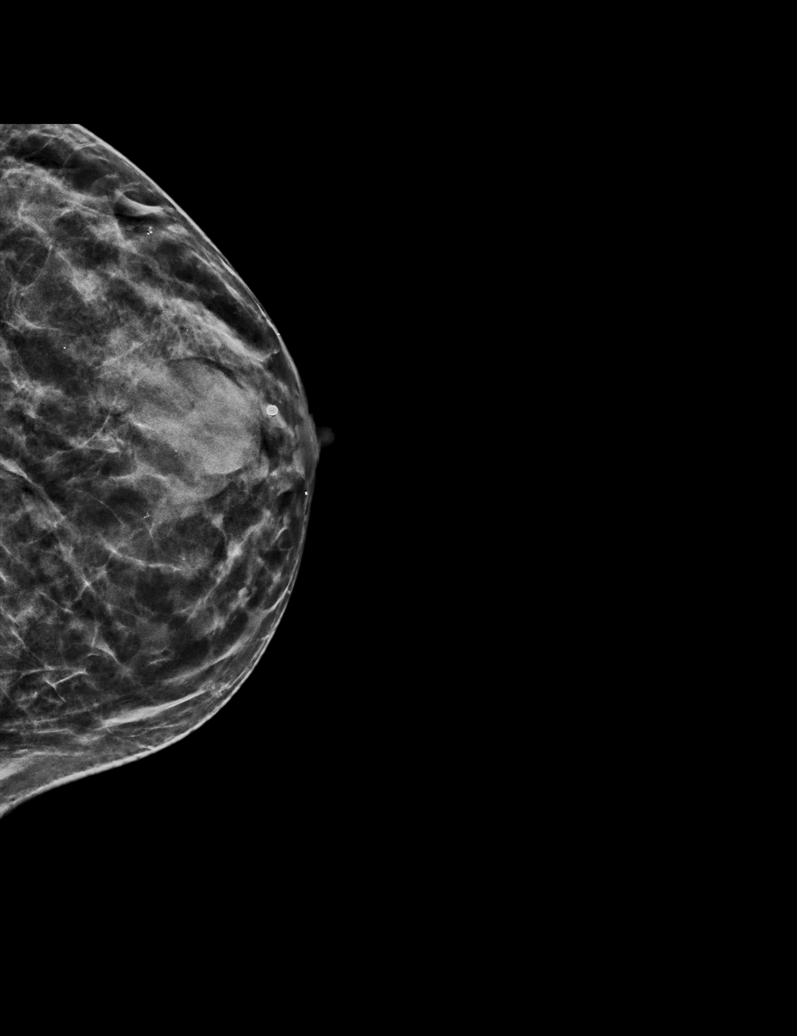

[R TAN synth-2D]
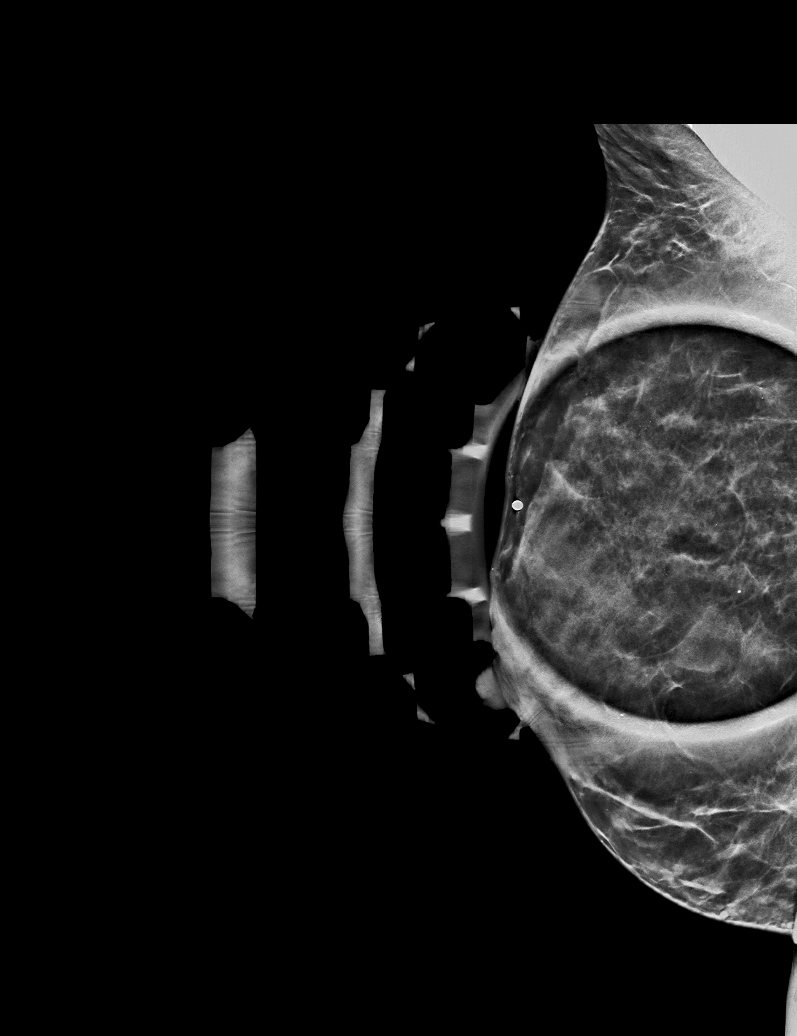

[R CC synth-2D]
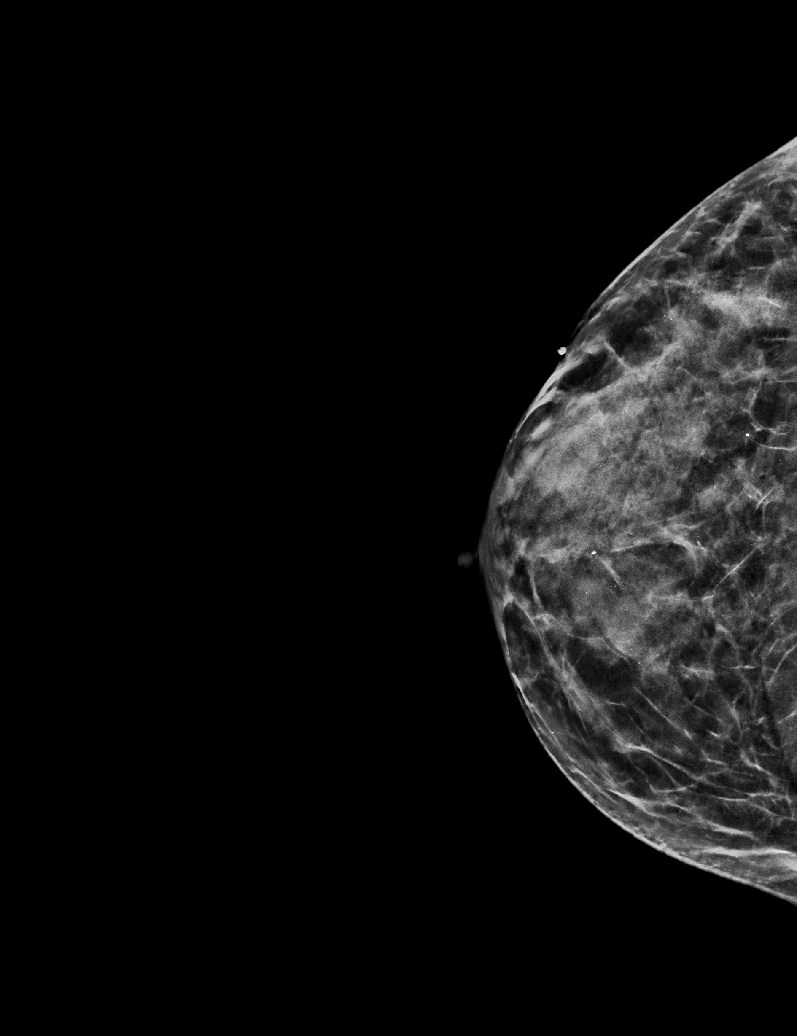

[L TAN synth-2D]
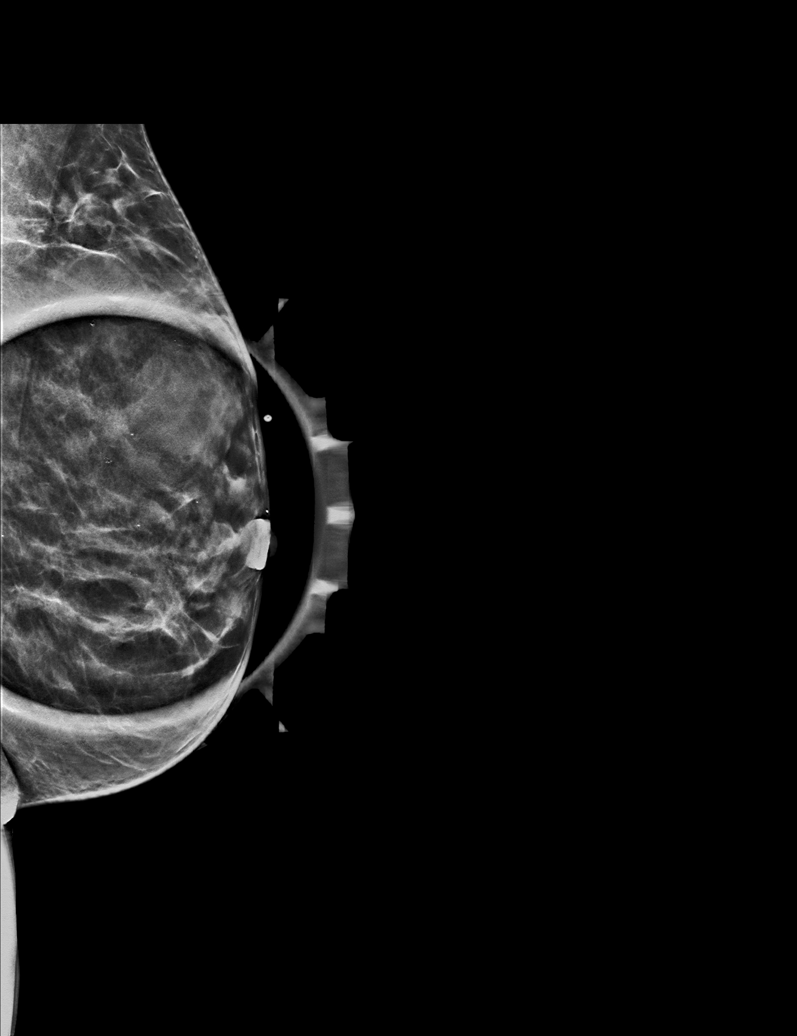

[L MLO synth-2D]
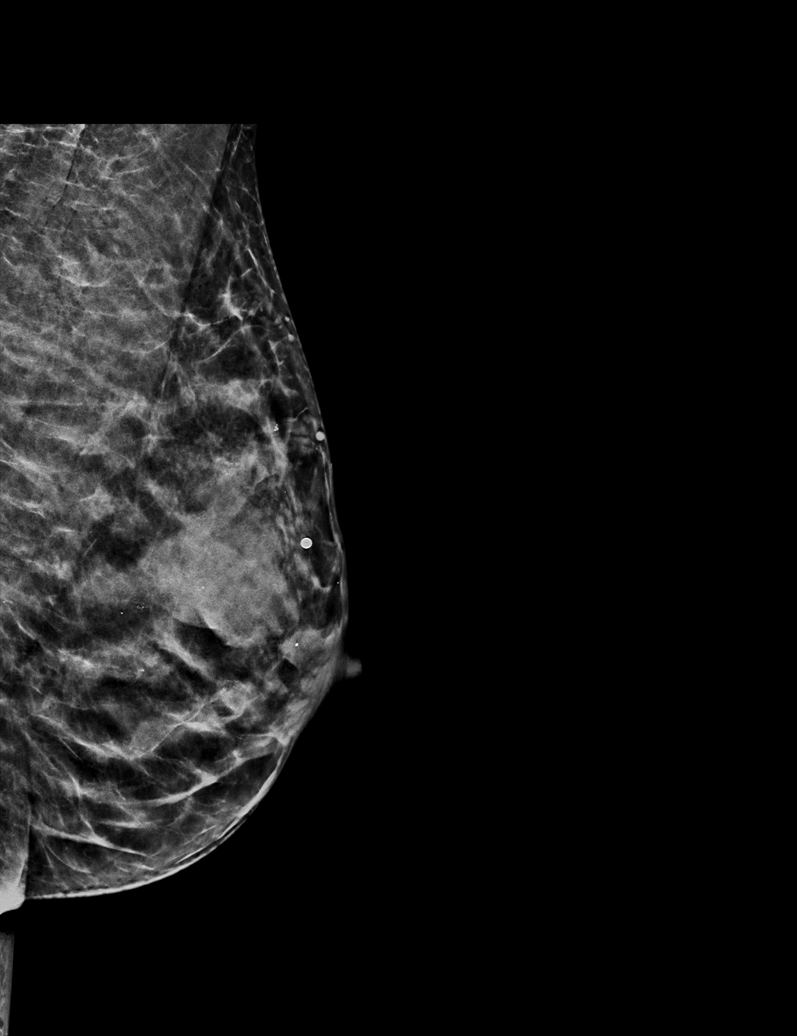

[R MLO synth-2D]
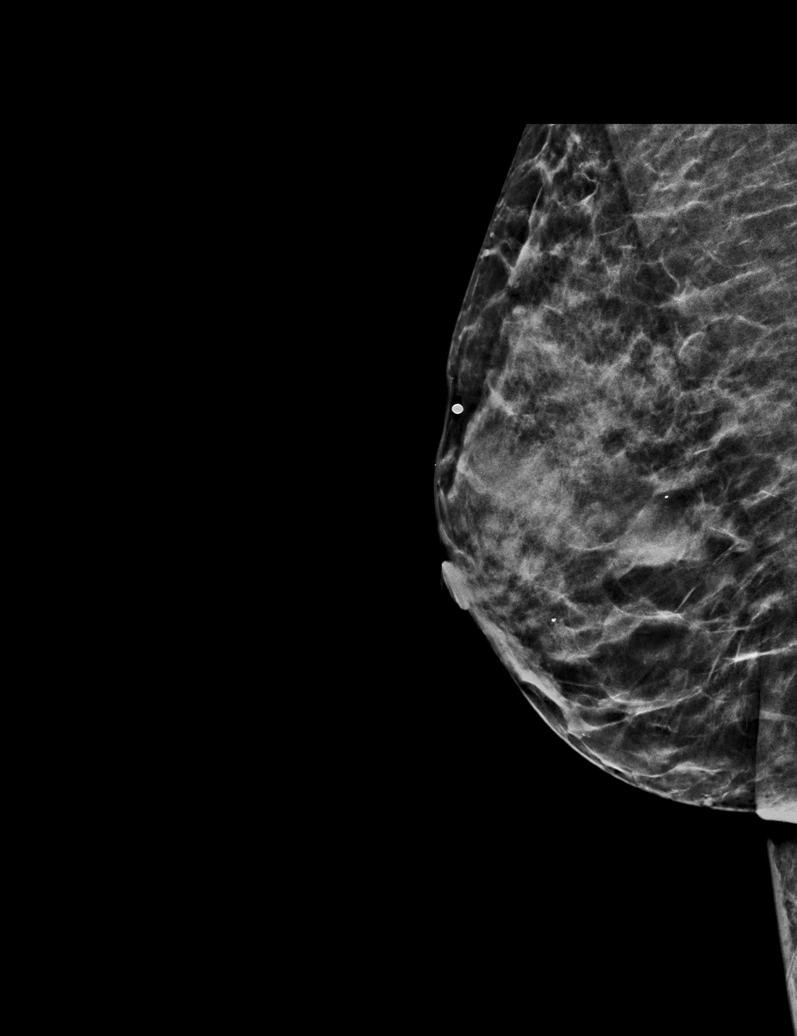

[6 of 36 positions shown; findings below may reference images not displayed]

ACR Breast Density Category c: The breast tissue is heterogeneously
dense, which may obscure small masses.
FINDINGS: There is an oval circumscribed mass corresponding to the marker in
the upper-outer right breast measuring approximately 2 cm. There is
an oval circumscribed in the upper central left breast corresponding
to the palpable area of concern measuring 2.9 cm. Numerous
additional oval and round circumscribed masses are present
throughout the bilateral breast.

Targeted ultrasound of the right breast was performed. There is a
cyst at site of palpable concern at 11 o'clock 1 cm from the nipple
measuring 1.9 x 1.6 x 1.9 cm. This spondy well with the mass in the
upper-outer right breast at mammography. Numerous additional cysts
are seen throughout the central right breast with a cyst in the
retroareolar right breast measuring 1.6 x 1.3 x 1.5 cm.

Targeted ultrasound of the left breast was performed. There is a
cyst corresponding to the site of palpable concern in the upper
central left breast measuring 2.9 x 1.8 x 3.2 cm. This corresponds
well with the mass seen at the site of concern in the left breast at
mammography. Numerous cysts are seen throughout the central left
breast.
IMPRESSION: Bilateral breast cysts.  No findings of malignancy in either breast.

RECOMMENDATION:
Screening mammogram at age 39 unless there are persistent or
intervening clinical concerns. (Code:H2-5-YPD)

I have discussed the findings and recommendations with the patient.
If applicable, a reminder letter will be sent to the patient
regarding the next appointment.

BI-RADS CATEGORY  2: Benign.

## 2023-04-10 IMAGING — US US BREAST*L* LIMITED INC AXILLA
1 series · 13 of 25 positions shown · non-contrast
Comparison: None.

CLINICAL DATA: 34-year-old female with bilateral palpable areas of
concern in each breast.



[Series 1: us breast*left* limited inc axilla · 0.07mm/px · 13 of 28 slices shown]
[im 1/28]
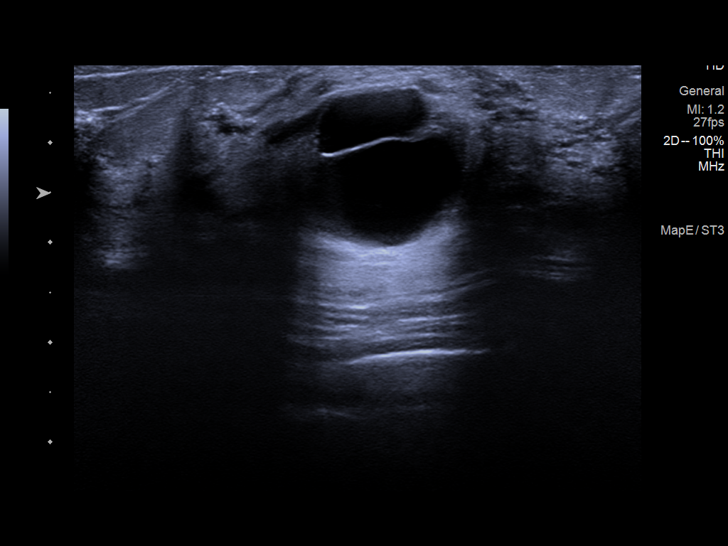
[im 3/28]
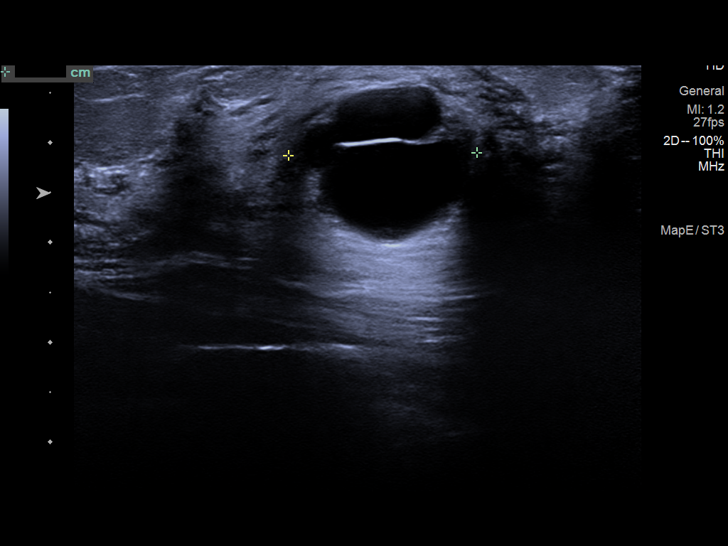
[im 5/28]
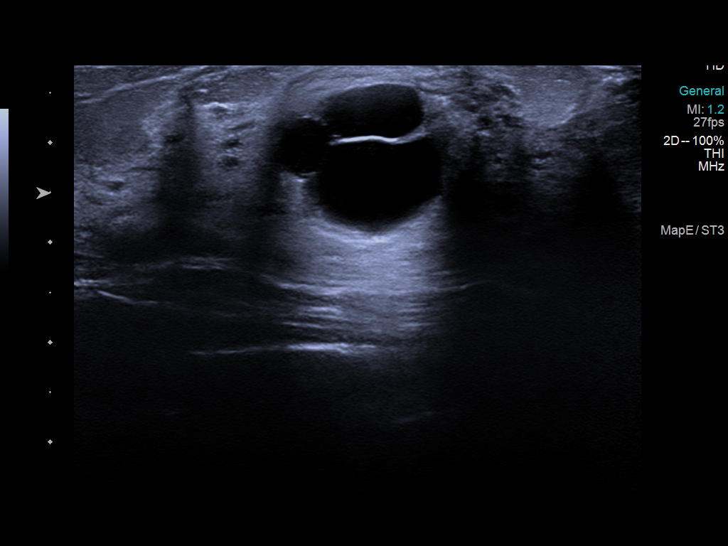
[im 7/28]
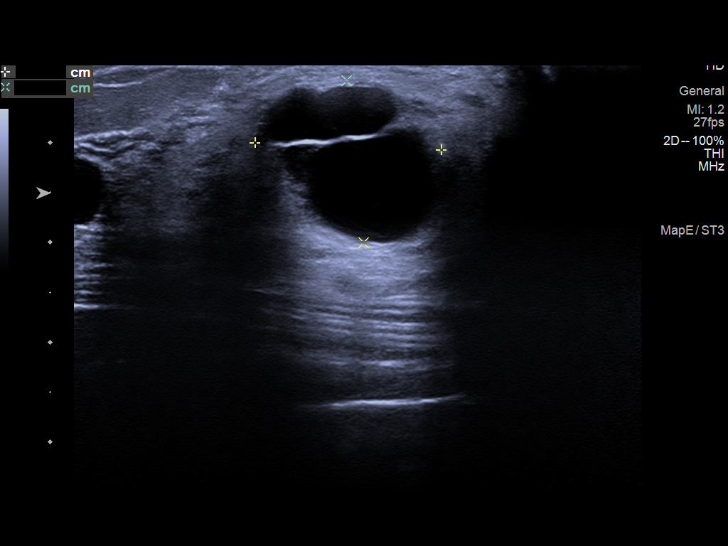
[im 10/28]
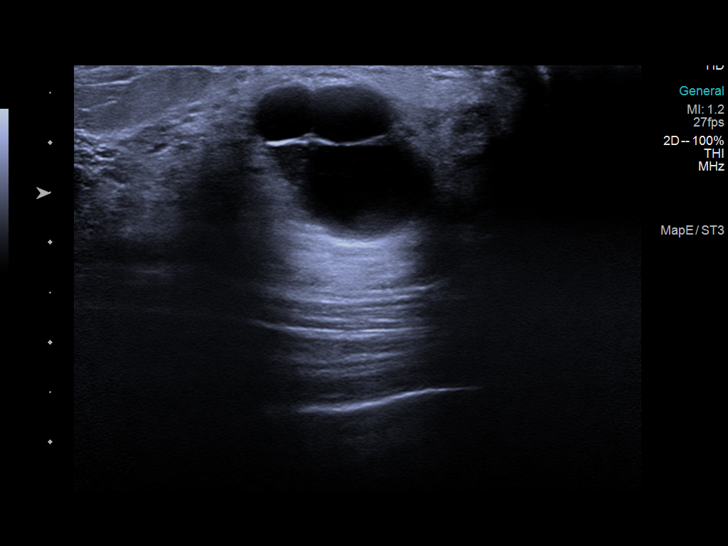
[im 12/28]
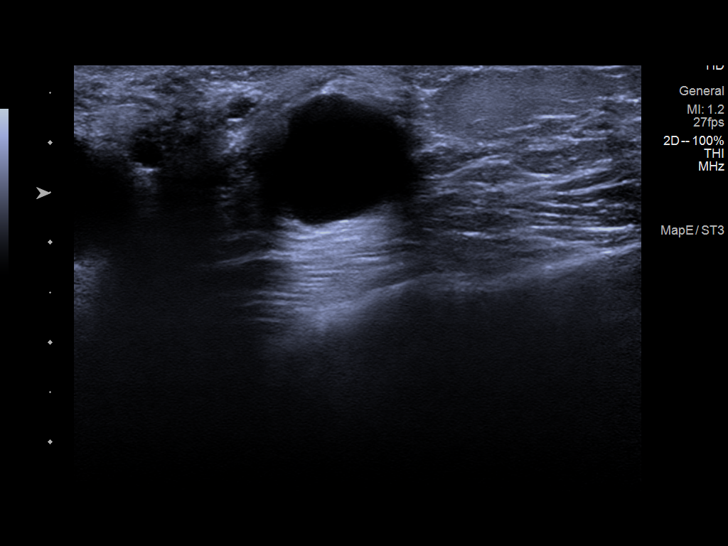
[im 14/28]
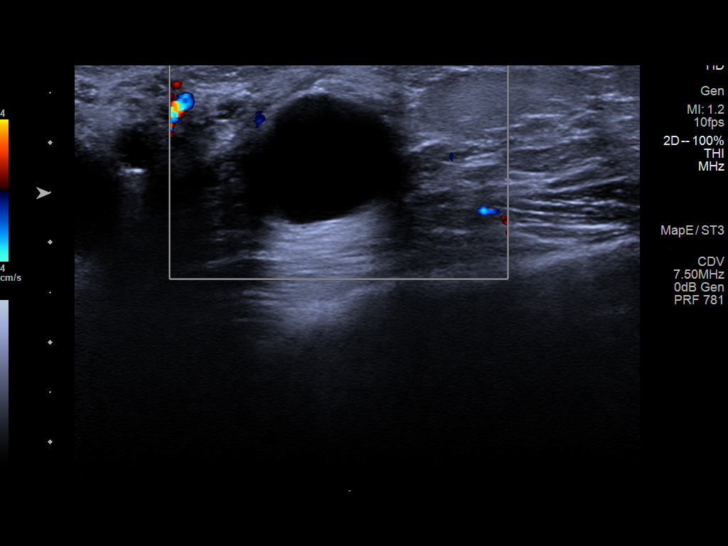
[im 16/28]
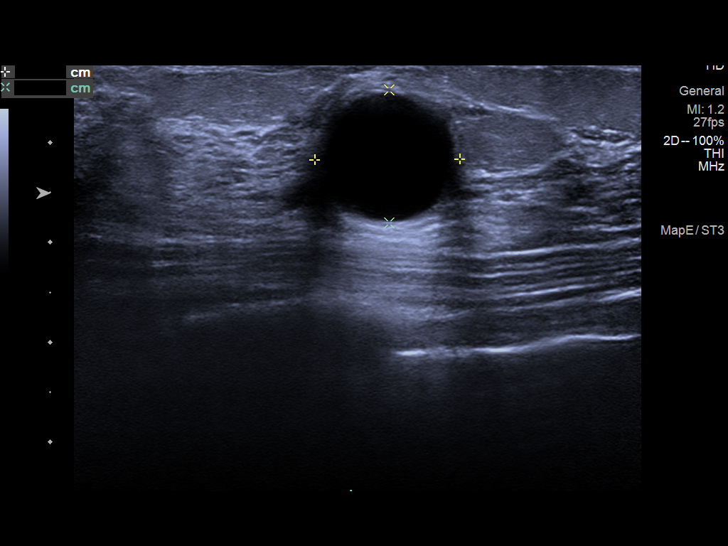
[im 19/28]
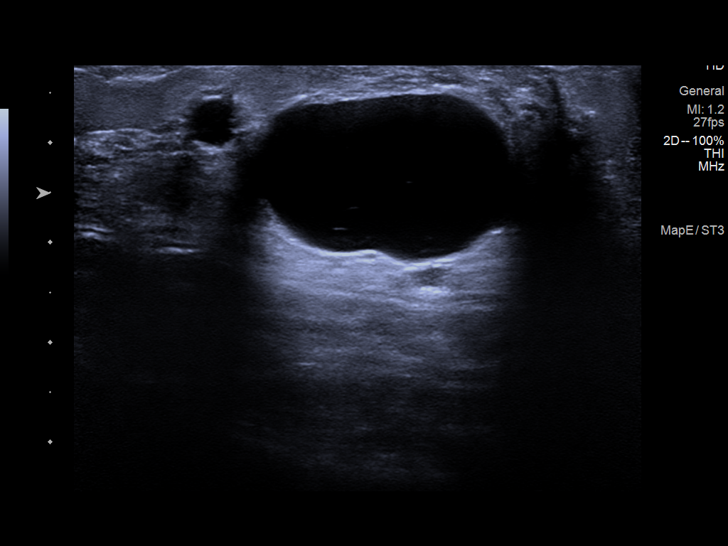
[im 21/28]
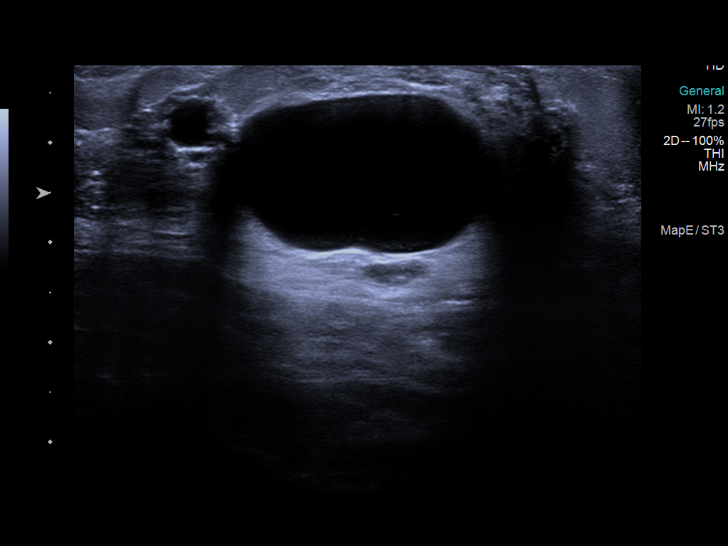
[im 23/28]
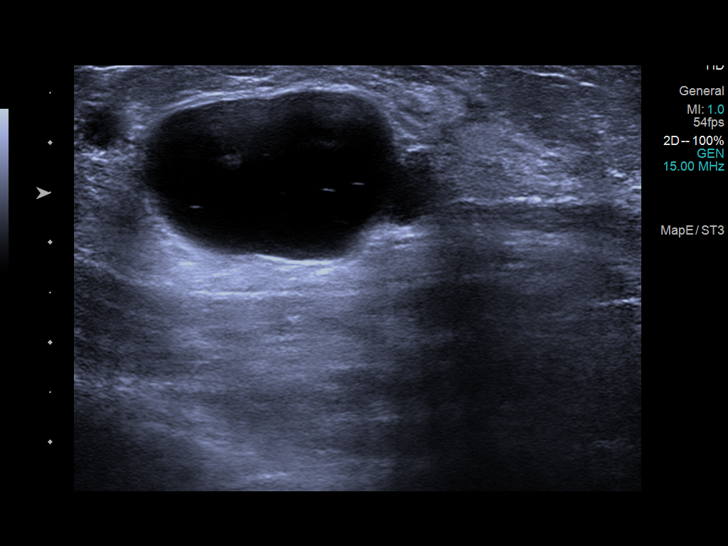
[im 25/28]
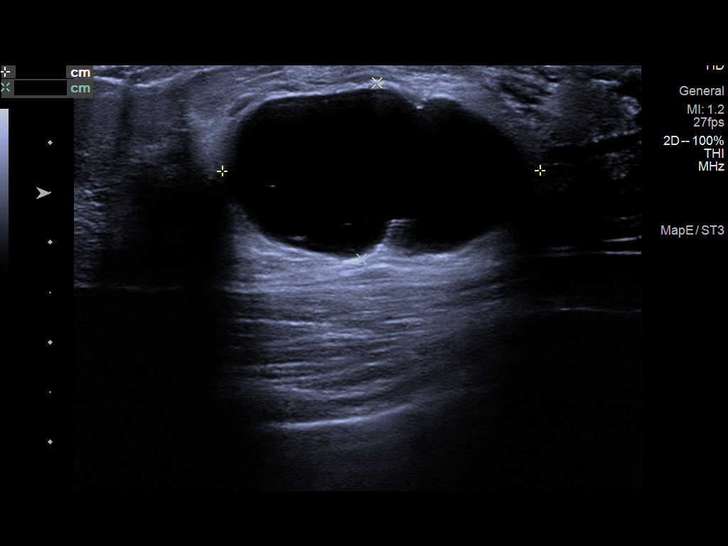
[im 28/28]
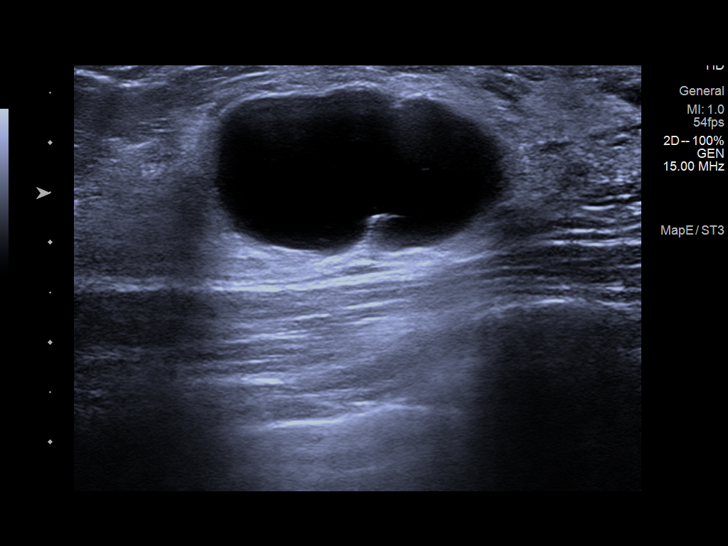

[13 of 25 positions shown; findings below may reference images not displayed]

ACR Breast Density Category c: The breast tissue is heterogeneously
dense, which may obscure small masses.
FINDINGS: There is an oval circumscribed mass corresponding to the marker in
the upper-outer right breast measuring approximately 2 cm. There is
an oval circumscribed in the upper central left breast corresponding
to the palpable area of concern measuring 2.9 cm. Numerous
additional oval and round circumscribed masses are present
throughout the bilateral breast.

Targeted ultrasound of the right breast was performed. There is a
cyst at site of palpable concern at 11 o'clock 1 cm from the nipple
measuring 1.9 x 1.6 x 1.9 cm. This spondy well with the mass in the
upper-outer right breast at mammography. Numerous additional cysts
are seen throughout the central right breast with a cyst in the
retroareolar right breast measuring 1.6 x 1.3 x 1.5 cm.

Targeted ultrasound of the left breast was performed. There is a
cyst corresponding to the site of palpable concern in the upper
central left breast measuring 2.9 x 1.8 x 3.2 cm. This corresponds
well with the mass seen at the site of concern in the left breast at
mammography. Numerous cysts are seen throughout the central left
breast.
IMPRESSION: Bilateral breast cysts.  No findings of malignancy in either breast.

RECOMMENDATION:
Screening mammogram at age 39 unless there are persistent or
intervening clinical concerns. (Code:H2-5-YPD)

I have discussed the findings and recommendations with the patient.
If applicable, a reminder letter will be sent to the patient
regarding the next appointment.

BI-RADS CATEGORY  2: Benign.

## 2024-04-17 ENCOUNTER — Ambulatory Visit (HOSPITAL_COMMUNITY)
Admission: RE | Admit: 2024-04-17 | Discharge: 2024-04-17 | Disposition: A | Discharge status: Home / Self Care | Source: Ambulatory Visit | Attending: *Deleted | Admitting: *Deleted

## 2024-04-17 ENCOUNTER — Other Ambulatory Visit (HOSPITAL_COMMUNITY): Payer: Self-pay | Admitting: *Deleted

## 2024-04-17 DIAGNOSIS — R229 Localized swelling, mass and lump, unspecified: Secondary | ICD-10-CM

## 2024-04-24 ENCOUNTER — Ambulatory Visit (HOSPITAL_COMMUNITY)
Admission: RE | Admit: 2024-04-24 | Discharge: 2024-04-24 | Disposition: A | Discharge status: Home / Self Care | Source: Ambulatory Visit | Attending: *Deleted | Admitting: *Deleted

## 2024-04-24 DIAGNOSIS — R229 Localized swelling, mass and lump, unspecified: Secondary | ICD-10-CM | POA: Insufficient documentation

## 2024-05-27 ENCOUNTER — Other Ambulatory Visit: Payer: Self-pay

## 2024-05-27 ENCOUNTER — Encounter (HOSPITAL_COMMUNITY): Payer: Self-pay | Admitting: *Deleted

## 2024-05-27 ENCOUNTER — Emergency Department (HOSPITAL_COMMUNITY)
Admission: EM | Admit: 2024-05-27 | Discharge: 2024-05-27 | Disposition: A | Discharge status: Home / Self Care | Attending: Emergency Medicine | Admitting: Emergency Medicine

## 2024-05-27 DIAGNOSIS — S8991XA Unspecified injury of right lower leg, initial encounter: Secondary | ICD-10-CM | POA: Diagnosis present

## 2024-05-27 DIAGNOSIS — X501XXA Overexertion from prolonged static or awkward postures, initial encounter: Secondary | ICD-10-CM | POA: Diagnosis not present

## 2024-05-27 DIAGNOSIS — Y9339 Activity, other involving climbing, rappelling and jumping off: Secondary | ICD-10-CM | POA: Diagnosis not present

## 2024-05-27 DIAGNOSIS — S86811A Strain of other muscle(s) and tendon(s) at lower leg level, right leg, initial encounter: Secondary | ICD-10-CM | POA: Diagnosis not present

## 2024-05-27 MED ORDER — KETOROLAC TROMETHAMINE 15 MG/ML IJ SOLN
30.0000 mg | Freq: Once | INTRAMUSCULAR | Status: AC
  Administered 2024-05-27: 30 mg via INTRAMUSCULAR
  Filled 2024-05-27: qty 2

## 2024-05-27 NOTE — ED Triage Notes (Signed)
 Pt states she was out letting her dogs out when they got tangled and they caused her to twist her right leg   Pt c/o pain in her calf  No obvious deformity noted

## 2024-05-27 NOTE — Discharge Instructions (Addendum)
 Use the crutches for the next several days to reduce the amount of weight on your right leg.  Keep it elevated Follow-up with the bone specialist in 1 week if no improvement.  You can use ibuprofen  400 mg 3 times a day

## 2024-05-27 NOTE — ED Provider Notes (Signed)
  [place] EMERGENCY DEPARTMENT AT [facility] Provider Note   CSN: [id number] Arrival date & time: 05/27/24  9394     Patient presents with: Leg Pain   [name] is a 38 y.o. female.   The history is provided by the patient.  Patient presents with right calf pain.  Patient reports that she was taking her dogs off the porch when she jumped off the porch which is around 3 steps high.  She reports twisting her leg when she landed and had immediate pain in her right calf.  She did not fall to the ground or hit her head.  Since that time she has had pain in the calf with walking.  No swelling.  She has tried home remedies without relief. No other acute complaints, no other traumatic injuries    Prior to Admission medications   Medication Sig Start Date End Date Taking? Authorizing Provider  etonogestrel (NEXPLANON) 68 MG IMPL implant Inject 1 each into the skin once.    [provider]    Allergies: Patient has no known allergies.    Review of Systems  Musculoskeletal:  Negative for back pain, joint swelling and neck pain.  Neurological:  Negative for headaches.    Updated Vital Signs BP 104/75 (BP Location: Left Arm)   Pulse 80   Temp 98.1 F (36.7 C) (Oral)   Resp 17   Ht 1.626 m (5' 4)   Wt 59 kg   SpO2 99%   BMI 22.31 kg/m   Physical Exam CONSTITUTIONAL: Well developed/well nourished HEAD: Normocephalic/atraumatic SPINE/BACK:entire spine nontender No bruising/crepitance/stepoffs noted to spine NEURO: Pt is awake/alert/appropriate, moves all extremitiesx4.  No facial droop.   EXTREMITIES: pulses normal/equal, full ROM Distal pulses equal and intact in both legs.  No deformities to lower extremities. The right Achilles appears intact.  There is no tenderness to the right thigh, right knee or right ankle or foot.  Full range of motion of right hip/knee/ankle Point tenderness noted to the right calf but no bruising or edema Pain is elicited  with plantar and dorsiflexion of the right foot SKIN: warm, color normal PSYCH: no abnormalities of mood noted, alert and oriented to situation  (all labs ordered are listed, but only abnormal results are displayed) Labs Reviewed - No data to display  EKG: None  Radiology: No results found.   Procedures   Medications Ordered in the ED  ketorolac  (TORADOL ) 15 MG/ML injection 30 mg (has no administration in time range)                                    Medical Decision Making Risk Prescription drug management.   Patient reports sudden onset of right calf pain after she jumped off the porch. There is no signs of any acute bony injury.  No spinal injury is noted  full range of motion of the extremities without difficulty.  Point tenderness noted to the right calf, likely calf strain.  Advised crutches, NSAIDs and elevation.  If no improvement in 1 week she can follow-up with orthopedic doctor Patient agreeable with plan     Final diagnoses:  Strain of calf muscle, right, initial encounter    ED Discharge Orders     None          Midge Golas, MD 05/27/24 [phone]

## 2024-08-05 ENCOUNTER — Other Ambulatory Visit: Payer: Self-pay

## 2024-08-05 ENCOUNTER — Encounter (HOSPITAL_COMMUNITY): Payer: Self-pay

## 2024-08-05 ENCOUNTER — Emergency Department (HOSPITAL_COMMUNITY): Admission: EM | Admit: 2024-08-05 | Discharge: 2024-08-05 | Disposition: A | Discharge status: Home / Self Care

## 2024-08-05 DIAGNOSIS — K1379 Other lesions of oral mucosa: Secondary | ICD-10-CM | POA: Diagnosis present

## 2024-08-05 DIAGNOSIS — K047 Periapical abscess without sinus: Secondary | ICD-10-CM | POA: Diagnosis not present

## 2024-08-05 MED ORDER — CHLORHEXIDINE GLUCONATE 0.12 % MT SOLN: 15.0000 mL | Freq: Two times a day (BID) | OROMUCOSAL | 0 refills | Status: AC

## 2024-08-05 MED ORDER — AMOXICILLIN 500 MG PO CAPS: 500.0000 mg | ORAL_CAPSULE | Freq: Three times a day (TID) | ORAL | 0 refills | Status: AC

## 2024-08-05 MED ORDER — NAPROXEN 500 MG PO TABS: 500.0000 mg | ORAL_TABLET | Freq: Two times a day (BID) | ORAL | 0 refills | Status: AC

## 2024-08-05 MED ORDER — NAPROXEN 250 MG PO TABS
500.0000 mg | ORAL_TABLET | Freq: Once | ORAL | Status: AC
  Administered 2024-08-05: 500 mg via ORAL
  Filled 2024-08-05: qty 2

## 2024-08-05 MED ORDER — AMOXICILLIN 250 MG PO CAPS
500.0000 mg | ORAL_CAPSULE | Freq: Once | ORAL | Status: AC
  Administered 2024-08-05: 500 mg via ORAL
  Filled 2024-08-05: qty 2

## 2024-08-05 NOTE — ED Notes (Signed)
 Pt/family received d/c paperwork at this time. After going over the paperwork any questions, comments, or concerns were answered to the best of this nurse's knowledge. The pt/family verbally acknowledged the teachings/instructions.

## 2024-08-05 NOTE — ED Triage Notes (Signed)
 Pt reports lesions to mouth and gums x 1 week.

## 2024-08-05 NOTE — Discharge Instructions (Addendum)
 Please follow-up closely with your dentist on an outpatient basis.  Take all medications as directed.  Return to emergency department immediately for any new or worsening symptoms.

## 2024-08-05 NOTE — ED Provider Notes (Signed)
  EMERGENCY DEPARTMENT AT [facility] Provider Note   CSN: [id number] Arrival date & time: 08/05/24  1100     Patient presents with: Mouth Lesions   [name] is a 38 y.o. female.   Patient is a 38 year old female who presents to the emergency department the chief complaint of pain to the inferior gumline which has been ongoing for approximate the past week.  Patient notes that she is scheduled to see a dentist later on this week.  She notes that she has had no associated fever, chills, chest pain, shortness of breath.  She has had no associated fever or chills.  She has had no stridor, dysphagia, drooling, dyspnea.   Mouth Lesions      Prior to Admission medications   Medication Sig Start Date End Date Taking? Authorizing Provider  etonogestrel (NEXPLANON) 68 MG IMPL implant Inject 1 each into the skin once.    [provider]    Allergies: Patient has no known allergies.    Review of Systems  HENT:  Positive for mouth sores.   All other systems reviewed and are negative.   Updated Vital Signs BP 118/73 (BP Location: Right Arm)   Pulse 90   Temp 98.9 F (37.2 C) (Oral)   Resp 16   Ht 5' 4 (1.626 m)   Wt 59 kg   LMP 08/03/2024 (Exact Date)   SpO2 99%   BMI 22.33 kg/m   Physical Exam Vitals and nursing note reviewed.  Constitutional:      General: She is not in acute distress.    Appearance: Normal appearance. She is not ill-appearing.  HENT:     Head: Normocephalic and atraumatic.     Nose: Nose normal.     Mouth/Throat:     Mouth: Mucous membranes are moist.     Comments: Swelling noted to inferior gumline along the anterior aspect and the left lateral aspect, no areas of induration or fluctuance, floor mouth is soft, tolerant secretion without difficulty, no peritonsillar swelling Eyes:     Extraocular Movements: Extraocular movements intact.     Conjunctiva/sclera: Conjunctivae normal.     Pupils: Pupils are equal,  round, and reactive to light.  Cardiovascular:     Rate and Rhythm: Normal rate and regular rhythm.     Pulses: Normal pulses.     Heart sounds: Normal heart sounds. No murmur heard.    No gallop.  Pulmonary:     Effort: Pulmonary effort is normal. No respiratory distress.     Breath sounds: Normal breath sounds. No stridor. No wheezing, rhonchi or rales.  Musculoskeletal:        General: Normal range of motion.     Cervical back: Normal range of motion and neck supple. No rigidity or tenderness.  Skin:    General: Skin is warm and dry.  Neurological:     General: No focal deficit present.     Mental Status: She is alert and oriented to person, place, and time. Mental status is at baseline.  Psychiatric:        Mood and Affect: Mood normal.        Behavior: Behavior normal.        Thought Content: Thought content normal.        Judgment: Judgment normal.     (all labs ordered are listed, but only abnormal results are displayed) Labs Reviewed - No data to display  EKG: None  Radiology: No results found.  Procedures   Medications Ordered in the ED  amoxicillin  (AMOXIL ) capsule 500 mg (500 mg Oral Given 08/05/24 1120)  naproxen  (NAPROSYN ) tablet 500 mg (500 mg Oral Given 08/05/24 1120)                                    Medical Decision Making Patient is doing well at this time and is stable for discharge home.  Discussed with patient that we will treat her for dental infection at this time.  Patient has no indication for an obvious drainable abscess.  She has no signs of acute respiratory distress.  She has no indication for acute peritonsillar abscess, Ludwig's angina, retropharyngeal abscess, epiglottitis.  She already has close follow-up with her dentist this week.  Strict turn precautions were discussed for any new or worsening symptoms.  Patient voiced understanding and had no additional questions.  Risk Prescription drug management.        Final diagnoses:   None    ED Discharge Orders     None          [name] [name] 08/05/24 1128    Simon [name], MD 08/05/24 [phone]
